# Patient Record
Sex: Female | Born: 1965 | Race: White | Hispanic: No | Smoking: Never smoker
Health system: Southern US, Community
[De-identification: ages and names within clinical notes are randomized; demographics above are authoritative.]

## PROBLEM LIST (undated history)

## (undated) DIAGNOSIS — N809 Endometriosis, unspecified: Secondary | ICD-10-CM

## (undated) DIAGNOSIS — S42402A Unspecified fracture of lower end of left humerus, initial encounter for closed fracture: Secondary | ICD-10-CM

## (undated) DIAGNOSIS — Z9189 Other specified personal risk factors, not elsewhere classified: Secondary | ICD-10-CM

## (undated) DIAGNOSIS — R911 Solitary pulmonary nodule: Secondary | ICD-10-CM

## (undated) DIAGNOSIS — Z803 Family history of malignant neoplasm of breast: Secondary | ICD-10-CM

## (undated) DIAGNOSIS — Z1371 Encounter for nonprocreative screening for genetic disease carrier status: Secondary | ICD-10-CM

## (undated) DIAGNOSIS — J45909 Unspecified asthma, uncomplicated: Secondary | ICD-10-CM

## (undated) HISTORY — PX: ELBOW SURGERY: SHX618

## (undated) HISTORY — DX: Other specified personal risk factors, not elsewhere classified: Z91.89

## (undated) HISTORY — DX: Unspecified asthma, uncomplicated: J45.909

## (undated) HISTORY — DX: Endometriosis, unspecified: N80.9

## (undated) HISTORY — DX: Encounter for nonprocreative screening for genetic disease carrier status: Z13.71

## (undated) HISTORY — DX: Solitary pulmonary nodule: R91.1

## (undated) HISTORY — PX: TONSILLECTOMY AND ADENOIDECTOMY: SUR1326

## (undated) HISTORY — DX: Family history of malignant neoplasm of breast: Z80.3

## (undated) HISTORY — DX: Unspecified fracture of lower end of left humerus, initial encounter for closed fracture: S42.402A

## (undated) HISTORY — PX: LAPAROSCOPIC TOTAL HYSTERECTOMY: SUR800

---

## 2008-06-07 ENCOUNTER — Ambulatory Visit: Payer: Self-pay | Admitting: Unknown Physician Specialty

## 2008-06-22 ENCOUNTER — Ambulatory Visit: Payer: Self-pay | Admitting: Unknown Physician Specialty

## 2008-08-10 ENCOUNTER — Ambulatory Visit: Payer: Self-pay | Admitting: Unknown Physician Specialty

## 2008-08-17 ENCOUNTER — Ambulatory Visit: Payer: Self-pay | Admitting: Unknown Physician Specialty

## 2016-02-09 ENCOUNTER — Inpatient Hospital Stay
Admission: RE | Admit: 2016-02-09 | Discharge: 2016-02-09 | Disposition: A | Discharge status: Home / Self Care | Payer: Self-pay | Source: Ambulatory Visit | Attending: *Deleted | Admitting: *Deleted

## 2016-02-09 ENCOUNTER — Other Ambulatory Visit: Payer: Self-pay | Admitting: *Deleted

## 2016-02-09 DIAGNOSIS — Z9289 Personal history of other medical treatment: Secondary | ICD-10-CM

## 2016-03-22 DIAGNOSIS — E7800 Pure hypercholesterolemia, unspecified: Secondary | ICD-10-CM | POA: Insufficient documentation

## 2016-03-22 DIAGNOSIS — J45909 Unspecified asthma, uncomplicated: Secondary | ICD-10-CM | POA: Insufficient documentation

## 2016-03-27 ENCOUNTER — Other Ambulatory Visit: Payer: Self-pay | Admitting: Family Medicine

## 2016-03-27 DIAGNOSIS — Z1231 Encounter for screening mammogram for malignant neoplasm of breast: Secondary | ICD-10-CM

## 2016-04-12 ENCOUNTER — Ambulatory Visit
Admission: RE | Admit: 2016-04-12 | Discharge: 2016-04-12 | Disposition: A | Discharge status: Home / Self Care | Payer: BLUE CROSS/BLUE SHIELD | Source: Ambulatory Visit | Attending: Family Medicine | Admitting: Family Medicine

## 2016-04-12 ENCOUNTER — Other Ambulatory Visit: Payer: Self-pay | Admitting: Family Medicine

## 2016-04-12 DIAGNOSIS — Z1231 Encounter for screening mammogram for malignant neoplasm of breast: Secondary | ICD-10-CM | POA: Diagnosis not present

## 2016-12-26 ENCOUNTER — Other Ambulatory Visit: Payer: Self-pay | Admitting: Obstetrics and Gynecology

## 2016-12-26 DIAGNOSIS — N6324 Unspecified lump in the left breast, lower inner quadrant: Secondary | ICD-10-CM

## 2017-01-09 ENCOUNTER — Encounter: Payer: Self-pay | Admitting: Obstetrics and Gynecology

## 2017-01-09 ENCOUNTER — Ambulatory Visit
Admission: RE | Admit: 2017-01-09 | Discharge: 2017-01-09 | Disposition: A | Discharge status: Home / Self Care | Payer: BLUE CROSS/BLUE SHIELD | Source: Ambulatory Visit | Attending: Obstetrics and Gynecology | Admitting: Obstetrics and Gynecology

## 2017-01-09 ENCOUNTER — Encounter: Payer: Self-pay | Admitting: Radiology

## 2017-01-09 DIAGNOSIS — N6324 Unspecified lump in the left breast, lower inner quadrant: Secondary | ICD-10-CM | POA: Diagnosis present

## 2017-06-11 ENCOUNTER — Encounter: Payer: Self-pay | Admitting: Certified Nurse Midwife

## 2017-06-11 ENCOUNTER — Ambulatory Visit (INDEPENDENT_AMBULATORY_CARE_PROVIDER_SITE_OTHER): Payer: BLUE CROSS/BLUE SHIELD | Admitting: Certified Nurse Midwife

## 2017-06-11 VITALS — BP 100/80 | HR 48 | Ht 62.0 in | Wt 178.0 lb

## 2017-06-11 DIAGNOSIS — Z01419 Encounter for gynecological examination (general) (routine) without abnormal findings: Secondary | ICD-10-CM | POA: Diagnosis not present

## 2017-06-11 DIAGNOSIS — Z1211 Encounter for screening for malignant neoplasm of colon: Secondary | ICD-10-CM

## 2017-06-11 DIAGNOSIS — Z803 Family history of malignant neoplasm of breast: Secondary | ICD-10-CM | POA: Insufficient documentation

## 2017-06-11 DIAGNOSIS — N949 Unspecified condition associated with female genital organs and menstrual cycle: Secondary | ICD-10-CM | POA: Diagnosis not present

## 2017-06-11 DIAGNOSIS — N898 Other specified noninflammatory disorders of vagina: Secondary | ICD-10-CM

## 2017-06-11 DIAGNOSIS — J45909 Unspecified asthma, uncomplicated: Secondary | ICD-10-CM | POA: Insufficient documentation

## 2017-06-11 LAB — POCT WET PREP (WET MOUNT): TRICHOMONAS WET PREP HPF POC: ABSENT

## 2017-06-11 NOTE — Progress Notes (Signed)
Gynecology Annual Exam  PCP: Kandyce RudBabaoff, Marcus, MD  Chief Complaint:  Chief Complaint  Patient presents with  . Gynecologic Exam    History of Present Illness: Shannon Hughes is a 51 y.o. nulliparous White female who  presents for her annual exam. The patient has no complaints today. Has noticed a white vaginal discharge at times (usually after bathing) but denies vulvovaginal itching or foul odor.   Her menses are absent due to a hysterectomy (TLH, BSO for endometriosis). She has had no spotting Last pap smear: 03/15/2016, results were NIL   The patient is not currently sexually active. She is asking questions regarding lubricants and moisturizers if she does become sexually active.  Since her last visit 03/15/2016, she had a diagnostic mammogram and ultrasound on her left breast for a possible mass on exam: results were negative. She reports that her 50 year old mother has had a stroke, but is recovering nicely.  Her past medical history is remarkable for asthma  The patient does perform self breast exams. Her last bilateral mammogram was 04/13/2016, results were negative.   There is a family history of breast cancer in her maternal aunt and maternal grandmother Genetic testing has not been done.  There is no family history of ovarian cancer.   The patient denies smoking.  She rarely drinks alcohol.  She denies illegal drug use.  The patient does not exercise. She does get adequate calcium in her diet. Her BMI=32.56 kg/m2  Her cholesterol screen was done last year by her PCP  and was borderline  The patient denies current symptoms of depression.    Review of Systems: Review of Systems  Constitutional: Negative for chills, fever and weight loss.  HENT: Negative for congestion, sinus pain and sore throat.   Eyes: Negative for blurred vision and pain.  Respiratory: Negative for hemoptysis, shortness of breath and wheezing.   Cardiovascular: Negative for chest pain,  palpitations and leg swelling.  Gastrointestinal: Negative for abdominal pain, blood in stool, diarrhea, heartburn, nausea and vomiting.  Genitourinary: Negative for dysuria, frequency, hematuria and urgency.       Positive for vaginal discharge  Musculoskeletal: Negative for back pain, joint pain and myalgias.  Skin: Negative for itching and rash.  Neurological: Negative for dizziness, tingling and headaches.  Endo/Heme/Allergies: Negative for environmental allergies and polydipsia. Does not bruise/bleed easily.       Negative for hirsutism   Psychiatric/Behavioral: Negative for depression. The patient is not nervous/anxious and does not have insomnia.     Past Medical History:  Past Medical History:  Diagnosis Date  . Asthma   . Endometriosis     Past Surgical History:  Past Surgical History:  Procedure Laterality Date  . ELBOW SURGERY    . LAPAROSCOPIC TOTAL HYSTERECTOMY     and BSO  . TONSILLECTOMY AND ADENOIDECTOMY     AGE 66 OR 7    Family History:  Family History  Problem Relation Age of Onset  . Breast cancer Maternal Aunt 42  . Breast cancer Maternal Grandmother 60  . Cancer Maternal Uncle 60       ? SKIN  . Stroke Mother     Social History:  Social History   Social History  . Marital status: Divorced    Spouse name: N/A  . Number of children: 0  . Years of education: N/A   Occupational History  . Daycare    Social History Main Topics  . Smoking status: Never  Smoker  . Smokeless tobacco: Never Used  . Alcohol use Yes     Comment: RARELY  . Drug use: No  . Sexual activity: No   Other Topics Concern  . Not on file   Social History Narrative  . No narrative on file    Allergies:  Allergies  Allergen Reactions  . Penicillins Anaphylaxis and Swelling    Medications: Prior to Admission medications   Not on File    Physical Exam Vitals: There were no vitals taken for this visit.  General: NAD HEENT: normocephalic, anicteric Neck: no  thyroid enlargement, no palpable nodules, no cervical lymphadenopathy  Pulmonary: No increased work of breathing, CTAB Cardiovascular: regular rate, but with occasional irregularity, without murmur  Breast: Breast symmetrical, no tenderness, no palpable nodules or masses, no skin or nipple retraction present, no nipple discharge.  No axillary, infraclavicular or supraclavicular lymphadenopathy. Abdomen: Soft, non-tender, non-distended.  Umbilicus without lesions.  No hepatomegaly or masses palpable. No evidence of hernia. Genitourinary:  External: Normal external female genitalia.  Normal urethral meatus, normal Bartholin's and Skene's glands.    Vagina: atrophic changes,  no evidence of prolapse.    Cervix: surgically absent  Uterus: surgically absent  Adnexa: No adnexal masses, non-tender  Rectal: deferred  Lymphatic: no evidence of inguinal lymphadenopathy Extremities: no edema, erythema, or tenderness Neurologic: Grossly intact Psychiatric: mood appropriate, affect full  Results for orders placed or performed in visit on 06/11/17 (from the past 24 hour(s))  POCT Wet Prep Mellody Drown([place]et [place])     Status: Normal   Collection Time: 06/11/17  5:43 PM  Result Value Ref Range   Source Wet Prep POC vaginal    WBC, Wet Prep HPF POC     Bacteria Wet Prep HPF POC  Few   BACTERIA WET PREP MORPHOLOGY POC     Clue Cells Wet Prep HPF POC None None   Clue Cells Wet Prep Whiff POC     Yeast Wet Prep HPF POC None    KOH Wet Prep POC     Trichomonas Wet Prep HPF POC Absent Absent     Assessment: 51 y.o. G0P0000 annual gyn exam Genital atrophy postmenopausally       Plan:   1) Breast cancer screening - recommend monthly self breast exam. Mammogram was ordered today. [facility]  2)Pap was not done.  Patient opts for every 3 years screening interval  3) Colon cancer screening-will refer for colonoscopy to Dr Markham JordanElliot  4) Discussed the difference between moisturizers and  lubricants and how they are used.  5) Routine healthcare maintenance including cholesterol and diabetes screening managed by PCP   Farrel Connersolleen Kinan Safley, CNM

## 2018-02-07 ENCOUNTER — Other Ambulatory Visit: Payer: Self-pay | Admitting: Certified Nurse Midwife

## 2018-02-07 DIAGNOSIS — Z1231 Encounter for screening mammogram for malignant neoplasm of breast: Secondary | ICD-10-CM

## 2018-02-24 ENCOUNTER — Ambulatory Visit
Admission: RE | Admit: 2018-02-24 | Discharge: 2018-02-24 | Disposition: A | Discharge status: Home / Self Care | Payer: BLUE CROSS/BLUE SHIELD | Source: Ambulatory Visit | Attending: Certified Nurse Midwife | Admitting: Certified Nurse Midwife

## 2018-02-24 DIAGNOSIS — Z1231 Encounter for screening mammogram for malignant neoplasm of breast: Secondary | ICD-10-CM | POA: Diagnosis present

## 2018-08-12 DIAGNOSIS — Z9189 Other specified personal risk factors, not elsewhere classified: Secondary | ICD-10-CM

## 2018-08-12 DIAGNOSIS — Z1371 Encounter for nonprocreative screening for genetic disease carrier status: Secondary | ICD-10-CM

## 2018-08-12 HISTORY — DX: Encounter for nonprocreative screening for genetic disease carrier status: Z13.71

## 2018-08-12 HISTORY — DX: Other specified personal risk factors, not elsewhere classified: Z91.89

## 2018-08-18 DIAGNOSIS — R739 Hyperglycemia, unspecified: Secondary | ICD-10-CM | POA: Insufficient documentation

## 2018-08-18 DIAGNOSIS — Z780 Asymptomatic menopausal state: Secondary | ICD-10-CM | POA: Insufficient documentation

## 2018-08-22 ENCOUNTER — Encounter: Payer: Self-pay | Admitting: Certified Nurse Midwife

## 2018-08-22 ENCOUNTER — Ambulatory Visit (INDEPENDENT_AMBULATORY_CARE_PROVIDER_SITE_OTHER): Payer: BLUE CROSS/BLUE SHIELD | Admitting: Certified Nurse Midwife

## 2018-08-22 VITALS — BP 110/60 | HR 65 | Ht 65.0 in | Wt 183.0 lb

## 2018-08-22 DIAGNOSIS — Z803 Family history of malignant neoplasm of breast: Secondary | ICD-10-CM | POA: Diagnosis not present

## 2018-08-22 DIAGNOSIS — Z1239 Encounter for other screening for malignant neoplasm of breast: Secondary | ICD-10-CM | POA: Diagnosis not present

## 2018-08-22 DIAGNOSIS — N951 Menopausal and female climacteric states: Secondary | ICD-10-CM | POA: Diagnosis not present

## 2018-08-22 DIAGNOSIS — Z01411 Encounter for gynecological examination (general) (routine) with abnormal findings: Secondary | ICD-10-CM | POA: Diagnosis not present

## 2018-08-22 DIAGNOSIS — Z01419 Encounter for gynecological examination (general) (routine) without abnormal findings: Secondary | ICD-10-CM

## 2018-08-22 NOTE — Progress Notes (Addendum)
Gynecology Annual Exam  PCP: Kandyce Rud, MD  Chief Complaint:  Chief Complaint  Patient presents with  . Gynecologic Exam    History of Present Illness: Shannon Hughes is a 52 y.o. nulliparous White female who  presents for her annual exam. The patient has developed hot flashes and night sweats recently and her PCP has started her on Paxil 10 mgm daily about 3-4 weeks ago. She has noticed some improvement. Took ET for less than 1 year immediately after surgical menopause.  Her menses are absent due to a hysterectomy (TLH, BSO for endometriosis in her later 30s). She has had no spotting Last pap smear: 03/15/2016, results were NIL   The patient is not currently sexually active. She is asking questions regarding lubricants and moisturizers if she does become sexually active.  Since her last visit 06/11/2017, she had no other significant changes to her health.  Her past medical history is remarkable for asthma, endometriosis, and hyperlipidemia  The patient does perform occ self breast exams. Her last bilateral mammogram was 02/24/18, results were negative.   There is a family history of breast cancer in her maternal aunt, maternal grandmother, and now her maternal cousin. Genetic testing has not been done.  There is no family history of ovarian cancer.   She had a colonoscopy 11/18/2017. Results: normal. Next due in 10 years.  The patient denies smoking.  She rarely drinks alcohol.  She denies illegal drug use.  The patient has been exercising at the gym 4x/week.. She does get adequate calcium in her diet. Her BMI=30.45 kg/m2  Her cholesterol screen was done this year  and was borderline  The patient denies current symptoms of depression.    Review of Systems: Review of Systems  Constitutional: Negative for chills, fever and weight loss.  HENT: Negative for congestion, sinus pain and sore throat.   Eyes: Negative for blurred vision and pain.  Respiratory: Positive  for cough. Negative for hemoptysis, shortness of breath and wheezing.   Cardiovascular: Negative for chest pain, palpitations and leg swelling.  Gastrointestinal: Negative for abdominal pain, blood in stool, diarrhea, heartburn, nausea and vomiting.  Genitourinary: Negative for dysuria, frequency, hematuria and urgency.       Positive for vaginal discharge  Musculoskeletal: Negative for back pain, joint pain and myalgias.  Skin: Negative for itching and rash.  Neurological: Negative for dizziness, tingling and headaches.  Endo/Heme/Allergies: Negative for environmental allergies and polydipsia. Does not bruise/bleed easily.       Positive for hirsutism and hot flashes   Psychiatric/Behavioral: Negative for depression. The patient is not nervous/anxious and does not have insomnia.     Past Medical History:  Past Medical History:  Diagnosis Date  . Asthma   . Endometriosis     Past Surgical History:  Past Surgical History:  Procedure Laterality Date  . ELBOW SURGERY    . LAPAROSCOPIC TOTAL HYSTERECTOMY     and BSO  . TONSILLECTOMY AND ADENOIDECTOMY     AGE 9 OR 7    Family History:  Family History  Problem Relation Age of Onset  . Breast cancer Maternal Aunt 70  . Breast cancer Maternal Grandmother 22  . Cancer Maternal Uncle 60       ? SKIN  . Stroke Mother   . Breast cancer Cousin 41       [name]    Social History:  Social History   Socioeconomic History  . Marital status: Divorced  Spouse name: Not on file  . Number of children: 0  . Years of education: Not on file  . Highest education level: Not on file  Occupational History  . Occupation: Daycare  Social Needs  . Financial resource strain: Not on file  . Food insecurity:    Worry: Not on file    Inability: Not on file  . Transportation needs:    Medical: Not on file    Non-medical: Not on file  Tobacco Use  . Smoking status: Never Smoker  . Smokeless tobacco: Never Used  Substance and  Sexual Activity  . Alcohol use: Yes    Comment: RARELY  . Drug use: No  . Sexual activity: Never    Birth control/protection: Post-menopausal  Lifestyle  . Physical activity:    Days per week: Not on file    Minutes per session: Not on file  . Stress: Not on file  Relationships  . Social connections:    Talks on phone: Not on file    Gets together: Not on file    Attends religious service: Not on file    Active member of club or organization: Not on file    Attends meetings of clubs or organizations: Not on file    Relationship status: Not on file  . Intimate partner violence:    Fear of current or ex partner: Not on file    Emotionally abused: Not on file    Physically abused: Not on file    Forced sexual activity: Not on file  Other Topics Concern  . Not on file  Social History Narrative  . Not on file    Allergies:  Allergies  Allergen Reactions  . Penicillins Anaphylaxis and Swelling    Medications: Vitamin D3 1000 IU daily Current Outpatient Medications on File Prior to Visit  Medication Sig Dispense Refill  . PARoxetine (PAXIL) 10 MG tablet   0  . albuterol (PROVENTIL HFA;VENTOLIN HFA) 108 (90 Base) MCG/ACT inhaler Inhale 2 puffs into the lungs as needed.     No current facility-administered medications on file prior to visit.    Physical Exam Vitals: BP 110/60   Pulse 65   Ht 5\' 5"  (1.651 m)   Wt 183 lb (83 kg)   BMI 30.45 kg/m   General: WF in NAD HEENT: normocephalic, anicteric Neck: no thyroid enlargement, no palpable nodules, no cervical lymphadenopathy  Pulmonary: No increased work of breathing, CTAB Cardiovascular: regular rate, but with occasional irregularity, without murmur  Breast: Breast symmetrical, no tenderness, no palpable nodules or masses, no skin or nipple retraction present, no nipple discharge.  No axillary, infraclavicular or supraclavicular lymphadenopathy. Abdomen: Soft, non-tender, non-distended.  Umbilicus without lesions.  No  hepatomegaly or masses palpable. No evidence of hernia. Genitourinary:  External: Normal external female genitalia.  Normal urethral meatus, normal Bartholin's and Skene's glands.    Vagina: atrophic changes,  no evidence of prolapse.    Cervix: surgically absent  Uterus: surgically absent  Adnexa: No adnexal masses, non-tender  Rectal: deferred  Lymphatic: no evidence of inguinal lymphadenopathy Extremities: no edema, erythema, or tenderness Neurologic: Grossly intact Psychiatric: mood appropriate, affect full    Assessment: 52 y.o. G0P0000 annual gyn exam Genital atrophy postmenopausally Vasomotor symptoms Family history of breast cancer   Plan:   1) Breast cancer screening - recommend monthly self breast exam. Mammogram was ordered today. Patient to schedule at [facility] after 02/25/2019. Patient desires MYRISK testing and blood work was drawn today. Follow up in  6 weeks for test results.  2)Pap was not done.  Patient opts for every 3 years screening interval  3) Colon cancer screening-colonoscopy this year. Repeat due in 10 years.  4) Discussed the difference between moisturizers and lubricants and how they are used. Patient would like trial of [place]. Discussed how to use.  5) Routine healthcare maintenance including cholesterol and diabetes screening managed by PCP    Farrel Conners, CNM

## 2018-08-24 ENCOUNTER — Encounter: Payer: Self-pay | Admitting: Certified Nurse Midwife

## 2018-09-02 ENCOUNTER — Encounter: Payer: Self-pay | Admitting: Obstetrics and Gynecology

## 2018-09-02 ENCOUNTER — Telehealth: Payer: Self-pay | Admitting: Certified Nurse Midwife

## 2018-09-02 NOTE — Telephone Encounter (Signed)
Left voicemail for patient to call back to be schedule with [facility] for My risk results.

## 2018-09-18 ENCOUNTER — Encounter: Payer: Self-pay | Admitting: Certified Nurse Midwife

## 2018-09-18 ENCOUNTER — Ambulatory Visit: Payer: BLUE CROSS/BLUE SHIELD | Admitting: Certified Nurse Midwife

## 2018-09-18 ENCOUNTER — Ambulatory Visit (INDEPENDENT_AMBULATORY_CARE_PROVIDER_SITE_OTHER): Payer: BLUE CROSS/BLUE SHIELD | Admitting: Certified Nurse Midwife

## 2018-09-18 VITALS — BP 90/60 | HR 66 | Ht 65.5 in | Wt 181.0 lb

## 2018-09-18 DIAGNOSIS — Z712 Person consulting for explanation of examination or test findings: Secondary | ICD-10-CM

## 2018-09-18 DIAGNOSIS — Z803 Family history of malignant neoplasm of breast: Secondary | ICD-10-CM

## 2018-09-18 NOTE — Progress Notes (Signed)
  Obstetrics & Gynecology Office Visit   Chief Complaint:  Chief Complaint  Patient presents with  . Follow-up    Test results    History of Present Illness: 52 year old nulliparous female with a family history of breast cancer presents for her MYRISK test results. The results were negative, however the TC Model calculates her lifetime risk of breast cancer at 21 % and the MYRISK calculation is 18.2%.   She has annual mammograms  And her last mammogram was 02/24/2018 and was negative. Her breast exam last month was normal.  Review of Systems:  ROS   Past Medical History:  Past Medical History:  Diagnosis Date  . Asthma   . BRCA negative 08/2018   MyRisk neg  . Endometriosis   . Family history of breast cancer   . Increased risk of breast cancer 08/2018   IBIS=21%/riskscore=18.2%  . Left elbow fracture   . Nodule of left lung     Past Surgical History:  Past Surgical History:  Procedure Laterality Date  . ELBOW SURGERY    . LAPAROSCOPIC TOTAL HYSTERECTOMY     and BSO-endometriosis  . TONSILLECTOMY AND ADENOIDECTOMY     AGE 6 OR 7    Gynecologic History: No LMP recorded. Patient has had a hysterectomy.  Obstetric History: G0P0000  Family History:  Family History  Problem Relation Age of Onset  . Breast cancer Maternal Aunt 42  . Breast cancer Maternal Grandmother 60  . Cancer Maternal Uncle 60       ? SKIN  . Stroke Mother   . Diabetes Mother   . Breast cancer Cousin 75       [name]    Social History:  Social History   Socioeconomic History  . Marital status: Divorced    Spouse name: Not on file  . Number of children: 0  . Years of education: Not on file  . Highest education level: Not on file  Occupational History  . Occupation: Daycare  Social Needs  . Financial resource strain: Not on file  . Food insecurity:    Worry: Not on file    Inability: Not on file  . Transportation needs:    Medical: Not on file    Non-medical: Not  on file  Tobacco Use  . Smoking status: Never Smoker  . Smokeless tobacco: Never Used  Substance and Sexual Activity  . Alcohol use: Yes    Comment: RARELY  . Drug use: No  . Sexual activity: Never    Birth control/protection: Post-menopausal  Lifestyle  . Physical activity:    Days per week: Not on file    Minutes per session: Not on file  . Stress: Not on file  Relationships  . Social connections:    Talks on phone: Not on file    Gets together: Not on file    Attends religious service: Not on file    Active member of club or organization: Not on file    Attends meetings of clubs or organizations: Not on file    Relationship status: Not on file  . Intimate partner violence:    Fear of current or ex partner: Not on file    Emotionally abused: Not on file    Physically abused: Not on file    Forced sexual activity: Not on file  Other Topics Concern  . Not on file  Social History Narrative  . Not on file    Allergies:  Allergies  Allergen   Reactions  . Penicillins Anaphylaxis and Swelling    Medications: Prior to Admission medications   Medication Sig Start Date End Date Taking? Authorizing Provider  albuterol (PROVENTIL HFA;VENTOLIN HFA) 108 (90 Base) MCG/ACT inhaler Inhale 2 puffs into the lungs as needed. 11/16/16 11/16/17  [provider]  azithromycin (ZITHROMAX) 250 MG tablet Take 1 tablet by mouth as needed. 08/31/18   [provider]  cholecalciferol (VITAMIN D) 1000 units tablet Take 1,000 Units by mouth daily.    [provider]  OVER THE COUNTER MEDICATION Take 1 tablet by mouth daily. multivitamin    [provider]  PARoxetine (PAXIL) 10 MG tablet  07/30/18   [provider]  PROAIR HFA 108 (90 Base) MCG/ACT inhaler Place 2 puffs into both nostrils as needed. 08/31/18   [provider]    Physical Exam Vitals:  Vitals:   09/18/18 1351  BP: 90/60  Pulse: 66   No LMP recorded. Patient has had a  hysterectomy.  Physical Exam  Constitutional: She is oriented to person, place, and time. She appears well-developed and well-nourished. No distress.  Cardiovascular: Normal rate.  Respiratory: Effort normal.  Neurological: She is alert and oriented to person, place, and time.  Psychiatric:  anxious     Assessment: 52 y.o. G0P0000 with negative MYRISK test but lifetime risk of breast cancer at 18-21%.  Plan: Recommend continuing annual 3D mammograms\ Explained that she also qualifies for annual breast MRIs and professional breast exams every 6 months She is in the process of changing insurances and would like to possibly start the breast MRIs next year. Advised to schedule breast exam in 6 months if desires.  Colleen Gutierrez, CNM  

## 2019-04-23 ENCOUNTER — Ambulatory Visit
Admission: RE | Admit: 2019-04-23 | Discharge: 2019-04-23 | Disposition: A | Discharge status: Home / Self Care | Payer: PRIVATE HEALTH INSURANCE | Source: Ambulatory Visit | Attending: Certified Nurse Midwife | Admitting: Certified Nurse Midwife

## 2019-04-23 ENCOUNTER — Other Ambulatory Visit: Payer: Self-pay

## 2019-04-23 DIAGNOSIS — Z1239 Encounter for other screening for malignant neoplasm of breast: Secondary | ICD-10-CM

## 2019-04-23 DIAGNOSIS — Z1231 Encounter for screening mammogram for malignant neoplasm of breast: Secondary | ICD-10-CM | POA: Insufficient documentation

## 2019-11-24 ENCOUNTER — Ambulatory Visit (INDEPENDENT_AMBULATORY_CARE_PROVIDER_SITE_OTHER): Payer: 59 | Admitting: Certified Nurse Midwife

## 2019-11-24 ENCOUNTER — Other Ambulatory Visit (HOSPITAL_COMMUNITY)
Admission: RE | Admit: 2019-11-24 | Discharge: 2019-11-24 | Disposition: A | Discharge status: Home / Self Care | Payer: PRIVATE HEALTH INSURANCE | Source: Ambulatory Visit | Attending: Certified Nurse Midwife | Admitting: Certified Nurse Midwife

## 2019-11-24 ENCOUNTER — Encounter: Payer: Self-pay | Admitting: Certified Nurse Midwife

## 2019-11-24 ENCOUNTER — Other Ambulatory Visit: Payer: Self-pay

## 2019-11-24 VITALS — BP 110/80 | HR 54 | Ht 60.0 in | Wt 186.0 lb

## 2019-11-24 DIAGNOSIS — Z1239 Encounter for other screening for malignant neoplasm of breast: Secondary | ICD-10-CM

## 2019-11-24 DIAGNOSIS — E785 Hyperlipidemia, unspecified: Secondary | ICD-10-CM | POA: Insufficient documentation

## 2019-11-24 DIAGNOSIS — N951 Menopausal and female climacteric states: Secondary | ICD-10-CM | POA: Insufficient documentation

## 2019-11-24 DIAGNOSIS — Z1272 Encounter for screening for malignant neoplasm of vagina: Secondary | ICD-10-CM

## 2019-11-24 DIAGNOSIS — Z01419 Encounter for gynecological examination (general) (routine) without abnormal findings: Secondary | ICD-10-CM

## 2019-11-24 NOTE — Progress Notes (Signed)
Gynecology Annual Exam  PCP: Derinda Late, MD  Chief Complaint:  Chief Complaint  Patient presents with  . Gynecologic Exam    one episode of upper left breast pain    History of Present Illness: Shannon Hughes is a 54 y.o. nulliparous White female who  presents for her annual exam.   Her menses are absent due to a hysterectomy (TLH, BSO for endometriosis in her later 63s). She has had no spotting The patient has been taking Paxil 10 mgm for hot flashes and night sweats with relief. Took ET for less than 1 year immediately after surgical menopause.  Last pap smear: 03/15/2016, results were NIL  The patient is not currently sexually active.   Since her last visit 08/22/2018, she had no other significant changes to her health. Has noticed some intermittent numbness in hands rt>left. More recently she had some transient left upper breast tenderness/ lumpiness. This resolved spontaneously. She does admit to drinking caffeine products  Her past medical history is remarkable for asthma, endometriosis, and hyperlipidemia  The patient does perform occ self breast exams. Her last bilateral mammogram was 04/23/2019, results were negative.   There is a family history of breast cancer in her maternal aunt, maternal grandmother, and now her maternal cousin. The patient had genetic testing and her Myrisk results were negative. Her lifetime risk of breast cancer is 18.2% according to [facility]  There is no family history of ovarian cancer.   She had a colonoscopy 11/18/2017. Results: normal. Next due in 10 years.  The patient denies smoking.  She rarely drinks alcohol.  She denies illegal drug use.  The patient has stopped exercising at the gym. She has been walking for exercise. She does get adequate calcium in her diet. Her BMI=30.63 kg/m2  Her cholesterol screen was done this year and was abnormal (TC 249 and triglycerides 469). Hemoglobin A1C is 6.0%  The patient denies current  symptoms of depression.    Review of Systems: Review of Systems  Constitutional: Negative for chills, fever and weight loss.  HENT: Negative for congestion, sinus pain and sore throat.   Eyes: Negative for blurred vision and pain.  Respiratory: Positive for cough. Negative for hemoptysis, shortness of breath and wheezing.   Cardiovascular: Negative for chest pain, palpitations and leg swelling.  Gastrointestinal: Negative for abdominal pain, blood in stool, diarrhea, heartburn, nausea and vomiting.  Genitourinary: Negative for dysuria, frequency, hematuria and urgency.  Musculoskeletal: Negative for back pain, joint pain and myalgias.  Skin: Negative for itching and rash.  Neurological: Negative for dizziness, tingling and headaches.  Endo/Heme/Allergies: Positive for environmental allergies (with sneezing and coughing). Negative for polydipsia. Does not bruise/bleed easily.       Positive for hirsutism and hot flashes   Psychiatric/Behavioral: Negative for depression. The patient is not nervous/anxious and does not have insomnia.     Past Medical History:  Past Medical History:  Diagnosis Date  . Asthma   . BRCA negative 08/2018   MyRisk neg  . Endometriosis   . Family history of breast cancer   . Increased risk of breast cancer 08/2018   IBIS=21%/riskscore=18.2%  . Left elbow fracture   . Nodule of left lung     Past Surgical History:  Past Surgical History:  Procedure Laterality Date  . ELBOW SURGERY    . LAPAROSCOPIC TOTAL HYSTERECTOMY     and BSO-endometriosis  . TONSILLECTOMY AND ADENOIDECTOMY     AGE 4 OR 7  Family History:  Family History  Problem Relation Age of Onset  . Breast cancer Maternal Aunt 42  . Breast cancer Maternal Grandmother 60  . Cancer Maternal Uncle 60       ? SKIN  . Stroke Mother   . Diabetes Mother   . Breast cancer Cousin 75       [name]    Social History:  Social History   Socioeconomic History  . Marital  status: Divorced    Spouse name: Not on file  . Number of children: 0  . Years of education: Not on file  . Highest education level: Not on file  Occupational History  . Occupation: Daycare  Tobacco Use  . Smoking status: Never Smoker  . Smokeless tobacco: Never Used  Substance and Sexual Activity  . Alcohol use: Yes    Comment: RARELY  . Drug use: No  . Sexual activity: Not Currently    Birth control/protection: Post-menopausal  Other Topics Concern  . Not on file  Social History Narrative  . Not on file   Social Determinants of Health   Financial Resource Strain:   . Difficulty of Paying Living Expenses: Not on file  Food Insecurity:   . Worried About Charity fundraiser in the Last Year: Not on file  . Ran Out of Food in the Last Year: Not on file  Transportation Needs:   . Lack of Transportation (Medical): Not on file  . Lack of Transportation (Non-Medical): Not on file  Physical Activity:   . Days of Exercise per Week: Not on file  . Minutes of Exercise per Session: Not on file  Stress:   . Feeling of Stress : Not on file  Social Connections:   . Frequency of Communication with Friends and Family: Not on file  . Frequency of Social Gatherings with Friends and Family: Not on file  . Attends Religious Services: Not on file  . Active Member of Clubs or Organizations: Not on file  . Attends Archivist Meetings: Not on file  . Marital Status: Not on file  Intimate Partner Violence:   . Fear of Current or Ex-Partner: Not on file  . Emotionally Abused: Not on file  . Physically Abused: Not on file  . Sexually Abused: Not on file    Allergies:  Allergies  Allergen Reactions  . Penicillins Anaphylaxis and Swelling    Medications: Vitamin D3 1000 IU daily Current Outpatient Medications on File Prior to Visit  Medication Sig Dispense Refill  . cholecalciferol (VITAMIN D) 1000 units tablet Take 1,000 Units by mouth daily.    Marland Kitchen OVER THE COUNTER MEDICATION  Take 1 tablet by mouth daily. multivitamin    . PARoxetine (PAXIL) 10 MG tablet   0  . PROAIR HFA 108 (90 Base) MCG/ACT inhaler Place 2 puffs into both nostrils as needed.  0  . albuterol (PROVENTIL HFA;VENTOLIN HFA) 108 (90 Base) MCG/ACT inhaler Inhale 2 puffs into the lungs as needed.     No current facility-administered medications on file prior to visit.   Physical Exam Vitals: BP 110/80   Pulse (!) 54   Ht 5' (1.524 m)   Wt 186 lb (84.4 kg)   BMI 36.33 kg/m   General: WF in NAD HEENT: normocephalic, anicteric Neck: no thyroid enlargement, no palpable nodules, no cervical lymphadenopathy  Pulmonary: No increased work of breathing, CTAB Cardiovascular: regular rate, but with occasional irregularity, without murmur  Breast: Breast symmetrical, no tenderness, no palpable  nodules or masses, no skin or nipple retraction present, no nipple discharge.  No axillary, infraclavicular or supraclavicular lymphadenopathy. Abdomen: Soft, non-tender, non-distended.  Umbilicus without lesions.  No hepatomegaly or masses palpable. No evidence of hernia. Genitourinary:  External: Normal external female genitalia.  Normal urethral meatus, normal Bartholin's and Skene's glands.    Vagina: atrophic changes,  no evidence of prolapse.    Cervix: surgically absent  Uterus: surgically absent  Adnexa: No adnexal masses, non-tender  Rectal: deferred  Lymphatic: no evidence of inguinal lymphadenopathy Extremities: no edema, erythema, or tenderness Neurologic: Grossly intact Psychiatric: mood appropriate, affect full    Assessment: 54 y.o. G0P0000 annual gyn exam Genital atrophy postmenopausally Family history of breast cancer Irregular heart rate Possible carpel tunnel symptoms   Plan:   1) Breast cancer screening - recommend monthly self breast exam and continued annual mammograms Mammogram due after 04/22/2020 2)Pap was done.  Patient opts for every 3 years screening interval  3) Colon  cancer screening-colonoscopy 2019. Repeat due in 10 years (2029)  4) Recommend making appointment with PCP for irregular heart beat (EKG) and carpel tunnel symptoms  5) Routine healthcare maintenance including cholesterol and diabetes screening managed by PCP   6) RTO in 1 year for annual   Dalia Heading, [place]

## 2019-11-26 ENCOUNTER — Telehealth: Payer: Self-pay

## 2019-11-26 LAB — CYTOLOGY - PAP: Diagnosis: NEGATIVE

## 2019-11-26 NOTE — Telephone Encounter (Signed)
Pt needs CLG to put in a referral for her EKG for her heart

## 2019-12-08 ENCOUNTER — Other Ambulatory Visit: Payer: Self-pay | Admitting: Certified Nurse Midwife

## 2019-12-08 DIAGNOSIS — I499 Cardiac arrhythmia, unspecified: Secondary | ICD-10-CM

## 2019-12-08 NOTE — Telephone Encounter (Signed)
EKG ordered

## 2019-12-10 ENCOUNTER — Ambulatory Visit: Payer: 59

## 2020-01-30 ENCOUNTER — Ambulatory Visit: Payer: 59 | Attending: Internal Medicine

## 2020-01-30 DIAGNOSIS — Z23 Encounter for immunization: Secondary | ICD-10-CM

## 2020-01-30 NOTE — Progress Notes (Signed)
   Covid-19 Vaccination Clinic  Name:  Shannon Hughes    MRN: [id number] DOB: 08/23/1966  01/30/2020  Shannon Hughes was observed post Covid-19 immunization for 15 minutes without incident. She was provided with Vaccine Information Sheet and instruction to access the V-Safe system.   Shannon Hughes was instructed to call 911 with any severe reactions post vaccine: Marland Kitchen Difficulty breathing  . Swelling of face and throat  . A fast heartbeat  . A bad rash all over body  . Dizziness and weakness   Immunizations Administered    Name Date Dose VIS Date Route   Pfizer COVID-19 Vaccine 01/30/2020  5:22 PM 0.3 mL 10/23/2019 Intramuscular   Manufacturer: [employer], [employer]   Lot: [id number]   NDC: [address]-3

## 2020-02-20 ENCOUNTER — Ambulatory Visit: Payer: 59 | Attending: Internal Medicine

## 2020-02-20 DIAGNOSIS — Z23 Encounter for immunization: Secondary | ICD-10-CM

## 2020-02-20 NOTE — Progress Notes (Signed)
   Covid-19 Vaccination Clinic  Name:  JULLIE ARPS    MRN: [id number] DOB: 07/12/1966  02/20/2020  Ms. Manges was observed post Covid-19 immunization for 15 minutes without incident. She was provided with Vaccine Information Sheet and instruction to access the V-Safe system.   Ms. Runions was instructed to call 911 with any severe reactions post vaccine: Marland Kitchen Difficulty breathing  . Swelling of face and throat  . A fast heartbeat  . A bad rash all over body  . Dizziness and weakness   Immunizations Administered    Name Date Dose VIS Date Route   Pfizer COVID-19 Vaccine 02/20/2020  4:51 PM 0.3 mL 10/23/2019 Intramuscular   Manufacturer: [employer], [employer]   Lot: [id number]   NDC: [address]-2

## 2020-03-15 ENCOUNTER — Other Ambulatory Visit: Payer: Self-pay

## 2020-03-15 ENCOUNTER — Encounter: Payer: Self-pay | Admitting: Obstetrics and Gynecology

## 2020-03-15 ENCOUNTER — Ambulatory Visit (INDEPENDENT_AMBULATORY_CARE_PROVIDER_SITE_OTHER): Payer: 59 | Admitting: Obstetrics and Gynecology

## 2020-03-15 VITALS — BP 110/80 | Ht 65.5 in | Wt 186.0 lb

## 2020-03-15 DIAGNOSIS — N6310 Unspecified lump in the right breast, unspecified quadrant: Secondary | ICD-10-CM

## 2020-03-15 DIAGNOSIS — Z1231 Encounter for screening mammogram for malignant neoplasm of breast: Secondary | ICD-10-CM

## 2020-03-15 DIAGNOSIS — N644 Mastodynia: Secondary | ICD-10-CM | POA: Diagnosis not present

## 2020-03-15 NOTE — Progress Notes (Signed)
Shannon Late, MD   Chief Complaint  Patient presents with  . Breast exam    lump on right breast, little pain noticed last week    HPI:      Shannon Hughes is a 54 y.o. G0P0000 who LMP was No LMP recorded. Patient has had a hysterectomy., presents today for RT breast mass and pain for the past wk. Area is tender and noted on SBE; very deep under nipple. Pt thinks area has gotten a little smaller since she first noticed it. No erythema, trauma, nipple d/c. No hx of breast masses. Neg mammo 04/23/19 at [facility]. Strong FH breast cancer, pt is MyRisk neg but increased risk based on FH. IBIS=21%/riskscore=18.2%.   Past Medical History:  Diagnosis Date  . Asthma   . BRCA negative 08/2018   MyRisk neg  . Endometriosis   . Family history of breast cancer   . Increased risk of breast cancer 08/2018   IBIS=21%/riskscore=18.2%  . Left elbow fracture   . Nodule of left lung     Past Surgical History:  Procedure Laterality Date  . ELBOW SURGERY    . LAPAROSCOPIC TOTAL HYSTERECTOMY     and BSO-endometriosis  . TONSILLECTOMY AND ADENOIDECTOMY     AGE 17 OR 7    Family History  Problem Relation Age of Onset  . Breast cancer Maternal Aunt 42  . Breast cancer Maternal Grandmother 60  . Cancer Maternal Uncle 60       ? SKIN  . Stroke Mother   . Diabetes Mother   . Breast cancer Cousin 80       Shannon Hughes's daughter    Social History   Socioeconomic History  . Marital status: Divorced    Spouse name: Not on file  . Number of children: 0  . Years of education: Not on file  . Highest education level: Not on file  Occupational History  . Occupation: Daycare  Tobacco Use  . Smoking status: Never Smoker  . Smokeless tobacco: Never Used  Substance and Sexual Activity  . Alcohol use: Yes    Comment: RARELY  . Drug use: No  . Sexual activity: Not Currently    Birth control/protection: Post-menopausal  Other Topics Concern  . Not on file  Social History Narrative  .  Not on file   Social Determinants of Health   Financial Resource Strain:   . Difficulty of Paying Living Expenses:   Food Insecurity:   . Worried About Charity fundraiser in the Last Year:   . Arboriculturist in the Last Year:   Transportation Needs:   . Film/video editor (Medical):   Marland Kitchen Lack of Transportation (Non-Medical):   Physical Activity:   . Days of Exercise per Week:   . Minutes of Exercise per Session:   Stress:   . Feeling of Stress :   Social Connections:   . Frequency of Communication with Friends and Family:   . Frequency of Social Gatherings with Friends and Family:   . Attends Religious Services:   . Active Member of Clubs or Organizations:   . Attends Archivist Meetings:   Marland Kitchen Marital Status:   Intimate Partner Violence:   . Fear of Current or Ex-Partner:   . Emotionally Abused:   Marland Kitchen Physically Abused:   . Sexually Abused:     Outpatient Medications Prior to Visit  Medication Sig Dispense Refill  . cholecalciferol (VITAMIN D) 1000 units tablet Take  1,000 Units by mouth daily.    Marland Kitchen OVER THE COUNTER MEDICATION Take 1 tablet by mouth daily. multivitamin    . PARoxetine (PAXIL) 10 MG tablet   0  . PROAIR HFA 108 (90 Base) MCG/ACT inhaler Place 2 puffs into both nostrils as needed.  0  . albuterol (PROVENTIL HFA;VENTOLIN HFA) 108 (90 Base) MCG/ACT inhaler Inhale 2 puffs into the lungs as needed.     No facility-administered medications prior to visit.      ROS:  Review of Systems  Constitutional: Negative for fever.  Gastrointestinal: Negative for blood in stool, constipation, diarrhea, nausea and vomiting.  Genitourinary: Negative for dyspareunia, dysuria, flank pain, frequency, hematuria, urgency, vaginal bleeding, vaginal discharge and vaginal pain.  Musculoskeletal: Negative for back pain.  Skin: Negative for rash.   BREAST: mass/pain   OBJECTIVE:   Vitals:  BP 110/80   Ht 5' 5.5" (1.664 m)   Wt 186 lb (84.4 kg)   BMI 30.48  kg/m   Physical Exam Vitals reviewed.  Pulmonary:     Effort: Pulmonary effort is normal.  Chest:     Breasts: Breasts are symmetrical.        Right: Mass and tenderness present. No inverted nipple, nipple discharge or skin change.        Left: No inverted nipple, mass, nipple discharge, skin change or tenderness.    Musculoskeletal:        General: Normal range of motion.     Cervical back: Normal range of motion.  Skin:    General: Skin is warm and dry.  Neurological:     General: No focal deficit present.     Mental Status: She is alert and oriented to person, place, and time.     Cranial Nerves: No cranial nerve deficit.  Psychiatric:        Mood and Affect: Mood normal.        Behavior: Behavior normal.        Thought Content: Thought content normal.        Judgment: Judgment normal.     Assessment/Plan: Breast mass, right - Plan: MM DIAG BREAST TOMO UNI RIGHT, US BREAST LTD UNI RIGHT INC AXILLA  Breast pain, right - Plan: MM DIAG BREAST TOMO UNI RIGHT, US BREAST LTD UNI RIGHT INC AXILLA  Check dx mammo and u/s RT breast. If neg, reassurance. No discrete mass on exam but pt states it is deep. Very tender on exam. Will f/u with results.     Return if symptoms worsen or fail to improve.  Arwilda Georgia B. Niyla Marone, PA-C 03/15/2020 4:51 PM

## 2020-03-15 NOTE — Patient Instructions (Signed)
I value your feedback and entrusting us with your care. If you get a [facility] patient survey, I would appreciate you taking the time to let us know about your experience today. Thank you!  As of October 22, 2019, your lab results will be released to your MyChart immediately, before I even have a chance to see them. Please give me time to review them and contact you if there are any abnormalities. Thank you for your patience.  

## 2020-03-16 NOTE — Addendum Note (Signed)
Addended by: Althea Grimmer B on: 03/16/2020 10:40 AM   Modules accepted: Orders

## 2020-03-23 ENCOUNTER — Other Ambulatory Visit: Payer: 59

## 2020-03-30 ENCOUNTER — Ambulatory Visit
Admission: RE | Admit: 2020-03-30 | Discharge: 2020-03-30 | Disposition: A | Discharge status: Home / Self Care | Payer: 59 | Source: Ambulatory Visit | Attending: Obstetrics and Gynecology | Admitting: Obstetrics and Gynecology

## 2020-03-30 DIAGNOSIS — N6310 Unspecified lump in the right breast, unspecified quadrant: Secondary | ICD-10-CM | POA: Diagnosis present

## 2020-03-30 DIAGNOSIS — N644 Mastodynia: Secondary | ICD-10-CM | POA: Diagnosis present

## 2020-03-30 DIAGNOSIS — Z1231 Encounter for screening mammogram for malignant neoplasm of breast: Secondary | ICD-10-CM | POA: Insufficient documentation

## 2020-04-08 ENCOUNTER — Ambulatory Visit: Payer: 59 | Admitting: Certified Nurse Midwife

## 2020-11-22 NOTE — Progress Notes (Signed)
PCP: Shannon Late, MD   Chief Complaint  Patient presents with  . Gynecologic Exam    HPI:      [name] is a 55 y.o. G0P0000 whose LMP was No LMP recorded. Patient has had a hysterectomy., presents today for her annual examination.  Her menses are absent due to [facility] in her 15s for endometriosis. Did ERT for 1 yr afterwards. Doing paxil 10 mg daily for vasomoor sx with sx control. Would like to continue.   Sex activity: not sexually active. She does not have vaginal dryness.  Last Pap: 11/24/19  Results were: no abnormalities ; s/p hyst; no hx of abn paps.  Last mammogram: 03/30/20  Results were: normal--routine follow-up in 12 months. Had RT breast mass sx with neg breast exam and neg imaging 5/21. Sx resolved. There is a FH of breast cancer in her mat aunt, MGM and mat cousin. There is no FH of ovarian cancer. Pt is MyRisk neg 2019 but increased risk based on FH. IBIS=21%/riskscore=18.2%.The patient does do self-breast exams, hasn't had screening breast MRI. Takes Vit D Supp.  Colonoscopy: 11/2017 without abnormalities; Repeat due after 10 years.   Tobacco use: The patient denies current or previous tobacco use. Alcohol use: none  No drug use. Exercise: moderately active  DEXA: never; qualifies due to early menopause  She does get adequate calcium and Vitamin D in her diet.  Labs with PCP in 2020 with elevated lipids and TGs, also with pre-DM. Is trying to lose wt before going back for f/u labs.    Past Medical History:  Diagnosis Date  . Asthma   . BRCA negative 08/2018   MyRisk neg  . Endometriosis   . Family history of breast cancer   . Increased risk of breast cancer 08/2018   IBIS=21%/riskscore=18.2%  . Left elbow fracture   . Nodule of left lung     Past Surgical History:  Procedure Laterality Date  . ELBOW SURGERY    . LAPAROSCOPIC TOTAL HYSTERECTOMY     and BSO-endometriosis  . TONSILLECTOMY AND ADENOIDECTOMY     AGE 38 OR 7    Family  History  Problem Relation Age of Onset  . Breast cancer Maternal Aunt 42  . Breast cancer Maternal Grandmother 60  . Cancer Maternal Uncle 60       ? SKIN  . Stroke Mother   . Diabetes Mother   . Breast cancer Cousin 71       [name]    Social History   Socioeconomic History  . Marital status: Divorced    Spouse name: Not on file  . Number of children: 0  . Years of education: Not on file  . Highest education level: Not on file  Occupational History  . Occupation: Daycare  Tobacco Use  . Smoking status: Never Smoker  . Smokeless tobacco: Never Used  Vaping Use  . Vaping Use: Never used  Substance and Sexual Activity  . Alcohol use: Yes    Comment: RARELY  . Drug use: No  . Sexual activity: Not Currently    Birth control/protection: Post-menopausal  Other Topics Concern  . Not on file  Social History Narrative  . Not on file   Social Determinants of Health   Financial Resource Strain: Not on file  Food Insecurity: Not on file  Transportation Needs: Not on file  Physical Activity: Not on file  Stress: Not on file  Social Connections: Not on file  Intimate  Partner Violence: Not on file     Current Outpatient Medications:  .  cholecalciferol (VITAMIN D) 1000 units tablet, Take 1,000 Units by mouth daily., Disp: , Rfl:  .  montelukast (SINGULAIR) 10 MG tablet, Take by mouth., Disp: , Rfl:  .  OVER THE COUNTER MEDICATION, Take 1 tablet by mouth daily. multivitamin, Disp: , Rfl:  .  albuterol (PROVENTIL HFA;VENTOLIN HFA) 108 (90 Base) MCG/ACT inhaler, Inhale 2 puffs into the lungs as needed., Disp: , Rfl:  .  PARoxetine (PAXIL) 10 MG tablet, Take 1 tablet (10 mg total) by mouth daily., Disp: 90 tablet, Rfl: 3     ROS:  Review of Systems  Constitutional: Negative for fatigue, fever and unexpected weight change.  Respiratory: Negative for cough, shortness of breath and wheezing.   Cardiovascular: Negative for chest pain, palpitations and leg  swelling.  Gastrointestinal: Negative for blood in stool, constipation, diarrhea, nausea and vomiting.  Endocrine: Negative for cold intolerance, heat intolerance and polyuria.  Genitourinary: Negative for dyspareunia, dysuria, flank pain, frequency, genital sores, hematuria, menstrual problem, pelvic pain, urgency, vaginal bleeding, vaginal discharge and vaginal pain.  Musculoskeletal: Positive for arthralgias. Negative for back pain, joint swelling and myalgias.  Skin: Negative for rash.  Neurological: Negative for dizziness, syncope, light-headedness, numbness and headaches.  Hematological: Negative for adenopathy.  Psychiatric/Behavioral: Negative for agitation, confusion, sleep disturbance and suicidal ideas. The patient is not nervous/anxious.    BREAST: No symptoms    Objective: BP 100/60   Ht 5' 1"  (1.549 m)   Wt 179 lb (81.2 kg)   BMI 33.82 kg/m    Physical Exam Constitutional:      Appearance: She is well-developed.  Genitourinary:     Vulva and vagina normal.     Genitourinary Comments: UTERUS/CX SURG REM     Right Labia: No tenderness or lesions.    Left Labia: No tenderness or lesions.    Vaginal cuff intact.    No vaginal discharge, erythema or tenderness.      Right Adnexa: absent.    Right Adnexa: not tender and no mass present.    Left Adnexa: absent.    Left Adnexa: not tender and no mass present.    Cervix is absent.     Uterus is absent.  Breasts:     Right: No mass, nipple discharge, skin change or tenderness.     Left: No mass, nipple discharge, skin change or tenderness.    Neck:     Thyroid: No thyromegaly.  Cardiovascular:     Rate and Rhythm: Normal rate and regular rhythm.     Heart sounds: Normal heart sounds. No murmur heard.   Pulmonary:     Effort: Pulmonary effort is normal.     Breath sounds: Normal breath sounds.  Abdominal:     Palpations: Abdomen is soft.     Tenderness: There is no abdominal tenderness. There is no guarding.   Musculoskeletal:        General: Normal range of motion.     Cervical back: Normal range of motion.  Neurological:     General: No focal deficit present.     Mental Status: She is alert and oriented to person, place, and time.     Cranial Nerves: No cranial nerve deficit.  Skin:    General: Skin is warm and dry.  Psychiatric:        Mood and Affect: Mood normal.        Behavior: Behavior normal.  Thought Content: Thought content normal.        Judgment: Judgment normal.  Vitals reviewed.     Assessment/Plan:  Encounter for annual routine gynecological examination  Encounter for screening mammogram for malignant neoplasm of breast - Plan: MM 3D SCREEN BREAST BILATERAL; pt to sched mammo  Early menopause - Plan: DG Bone Density; check DEXA. Will f/u with results.   Screening for osteoporosis - Plan: DG Bone Density  Menopausal symptoms - Plan: PARoxetine (PAXIL) 10 MG tablet; Rx RF paxil   Meds ordered this encounter  Medications  . PARoxetine (PAXIL) 10 MG tablet    Sig: Take 1 tablet (10 mg total) by mouth daily.    Dispense:  90 tablet    Refill:  3    Order Specific Question:   Supervising Provider    Answer:   Gae Dry [[id number]]            GYN counsel breast self exam, mammography screening, adequate intake of calcium and vitamin D, diet and exercise    F/U  Return in about 1 year (around 11/24/2021).  Kohle Winner B. Osman Calzadilla, PA-C 11/24/2020 2:37 PM

## 2020-11-24 ENCOUNTER — Ambulatory Visit (INDEPENDENT_AMBULATORY_CARE_PROVIDER_SITE_OTHER): Payer: 59 | Admitting: Obstetrics and Gynecology

## 2020-11-24 ENCOUNTER — Other Ambulatory Visit: Payer: Self-pay

## 2020-11-24 ENCOUNTER — Encounter: Payer: Self-pay | Admitting: Obstetrics and Gynecology

## 2020-11-24 VITALS — BP 100/60 | Ht 61.0 in | Wt 179.0 lb

## 2020-11-24 DIAGNOSIS — Z1231 Encounter for screening mammogram for malignant neoplasm of breast: Secondary | ICD-10-CM

## 2020-11-24 DIAGNOSIS — Z13820 Encounter for screening for osteoporosis: Secondary | ICD-10-CM

## 2020-11-24 DIAGNOSIS — N951 Menopausal and female climacteric states: Secondary | ICD-10-CM

## 2020-11-24 DIAGNOSIS — Z01419 Encounter for gynecological examination (general) (routine) without abnormal findings: Secondary | ICD-10-CM

## 2020-11-24 DIAGNOSIS — E28319 Asymptomatic premature menopause: Secondary | ICD-10-CM | POA: Diagnosis not present

## 2020-11-24 MED ORDER — PAROXETINE HCL 10 MG PO TABS
10.0000 mg | ORAL_TABLET | Freq: Every day | ORAL | 3 refills | Status: DC
Start: 2020-11-24 — End: 2024-03-23

## 2020-11-24 NOTE — Patient Instructions (Addendum)
I value your feedback and you entrusting us with your care. If you get a [facility] patient survey, I would appreciate you taking the time to let us know about your experience today. Thank you!  [facility]: [phone]   [facility]: [phone]    

## 2020-12-13 ENCOUNTER — Other Ambulatory Visit: Payer: 59

## 2021-09-27 ENCOUNTER — Other Ambulatory Visit: Payer: Self-pay

## 2021-09-27 ENCOUNTER — Encounter: Payer: Self-pay | Admitting: Obstetrics and Gynecology

## 2021-09-27 ENCOUNTER — Ambulatory Visit (INDEPENDENT_AMBULATORY_CARE_PROVIDER_SITE_OTHER): Payer: BLUE CROSS/BLUE SHIELD | Admitting: Obstetrics and Gynecology

## 2021-09-27 VITALS — BP 100/60 | Ht 61.0 in | Wt 175.0 lb

## 2021-09-27 DIAGNOSIS — Z9189 Other specified personal risk factors, not elsewhere classified: Secondary | ICD-10-CM | POA: Diagnosis not present

## 2021-09-27 DIAGNOSIS — Z803 Family history of malignant neoplasm of breast: Secondary | ICD-10-CM | POA: Diagnosis not present

## 2021-09-27 DIAGNOSIS — N644 Mastodynia: Secondary | ICD-10-CM | POA: Diagnosis not present

## 2021-09-27 DIAGNOSIS — Z1231 Encounter for screening mammogram for malignant neoplasm of breast: Secondary | ICD-10-CM | POA: Diagnosis not present

## 2021-09-27 NOTE — Progress Notes (Signed)
Kandyce Rud, MD   Chief Complaint  Patient presents with   Breast Problem    Bilateral breast tenderness, more in Rt breast x2 wks. RM 13     HPI:      [name] is a 55 y.o. G0P0000 whose LMP was No LMP recorded. Patient has had a hysterectomy., presents today for bilat breast tenderness for 2 wks, mostly RT breast. Feels like an ache, non-tender to palpate. No masses, trauma, erythema, nipple d/c. Drinks caffeine daily. Pt called to sched mammo and told needed an appt first. Neg mammo 5/21. Pt does SBE. Hx of  RT breast mass sx with neg breast exam and neg imaging 5/21. Sx resolved. There is a FH of breast cancer in her mat aunt, MGM and mat cousin. There is no FH of ovarian cancer. Pt is MyRisk neg 2019 but increased risk based on FH. IBIS=21%/riskscore=18.2%.The patient does do self-breast exams, hasn't had screening breast MRI. Takes Vit D Supp.  Patient Active Problem List   Diagnosis Date Noted   Increased risk of breast cancer 09/27/2021   Hyperlipidemia 11/24/2019   Menopausal symptoms 11/24/2019   Asthma 06/11/2017   Family history of breast cancer 06/11/2017    Past Surgical History:  Procedure Laterality Date   ELBOW SURGERY     LAPAROSCOPIC TOTAL HYSTERECTOMY     and BSO-endometriosis   TONSILLECTOMY AND ADENOIDECTOMY     AGE 74 OR 7    Family History  Problem Relation Age of Onset   Breast cancer Maternal Aunt 42   Breast cancer Maternal Grandmother 60   Cancer Maternal Uncle 60       ? SKIN   Stroke Mother    Diabetes Mother    Breast cancer Cousin 14       Shannon Hughes    Social History   Socioeconomic History   Marital status: Divorced    Spouse name: Not on file   Number of children: 0   Years of education: Not on file   Highest education level: Not on file  Occupational History   Occupation: Daycare  Tobacco Use   Smoking status: Never   Smokeless tobacco: Never  Vaping Use   Vaping Use: Never used  Substance  and Sexual Activity   Alcohol use: Yes    Comment: RARELY   Drug use: No   Sexual activity: Not Currently    Birth control/protection: Post-menopausal  Other Topics Concern   Not on file  Social History Narrative   Not on file   Social Determinants of Health   Financial Resource Strain: Not on file  Food Insecurity: Not on file  Transportation Needs: Not on file  Physical Activity: Not on file  Stress: Not on file  Social Connections: Not on file  Intimate Partner Violence: Not on file    Outpatient Medications Prior to Visit  Medication Sig Dispense Refill   cholecalciferol (VITAMIN D) 1000 units tablet Take 1,000 Units by mouth daily.     PARoxetine (PAXIL) 10 MG tablet Take 1 tablet (10 mg total) by mouth daily. 90 tablet 3   albuterol (PROVENTIL HFA;VENTOLIN HFA) 108 (90 Base) MCG/ACT inhaler Inhale 2 puffs into the lungs as needed.     montelukast (SINGULAIR) 10 MG tablet Take by mouth.     OVER THE COUNTER MEDICATION Take 1 tablet by mouth daily. multivitamin     No facility-administered medications prior to visit.      ROS:  Review of  Systems  Constitutional:  Negative for fever.  Gastrointestinal:  Negative for blood in stool, constipation, diarrhea, nausea and vomiting.  Genitourinary:  Negative for dyspareunia, dysuria, flank pain, frequency, hematuria, urgency, vaginal bleeding, vaginal discharge and vaginal pain.  Musculoskeletal:  Negative for back pain.  Skin:  Negative for rash.  BREAST: ache   OBJECTIVE:   Vitals:  BP 100/60   Ht 5\' 1"  (1.549 m)   Wt 175 lb (79.4 kg)   BMI 33.07 kg/m   Physical Exam Vitals reviewed.  Pulmonary:     Effort: Pulmonary effort is normal.  Chest:  Breasts:    Breasts are symmetrical.     Right: No inverted nipple, mass, nipple discharge, skin change or tenderness.     Left: No inverted nipple, mass, nipple discharge, skin change or tenderness.    Musculoskeletal:        General: Normal range of motion.      Cervical back: Normal range of motion.  Skin:    General: Skin is warm and dry.  Neurological:     General: No focal deficit present.     Mental Status: She is alert and oriented to person, place, and time.     Cranial Nerves: No cranial nerve deficit.  Psychiatric:        Mood and Affect: Mood normal.        Behavior: Behavior normal.        Thought Content: Thought content normal.        Judgment: Judgment normal.    Assessment/Plan: Breast tenderness--general ache, no pain with palpation. D/C caffeine. No masses, neg breast exam. Can do screening mammo. F/u prn.   Encounter for screening mammogram for malignant neoplasm of breast - Plan: MM 3D SCREEN BREAST BILATERAL  Family history of breast cancer - Plan: MM 3D SCREEN BREAST BILATERAL  Increased risk of breast cancer - Plan: MM 3D SCREEN BREAST BILATERAL; pt aware of monthly SBE, yearly CBE and mammos, and scr breast MRI. Will call for MRI ref if desires.     No follow-ups on file.  Gurbani Figge B. Alizae Bechtel, PA-C 09/27/2021 5:27 PM

## 2021-12-04 DIAGNOSIS — R739 Hyperglycemia, unspecified: Secondary | ICD-10-CM | POA: Diagnosis not present

## 2021-12-04 DIAGNOSIS — E7800 Pure hypercholesterolemia, unspecified: Secondary | ICD-10-CM | POA: Diagnosis not present

## 2021-12-04 DIAGNOSIS — Z0000 Encounter for general adult medical examination without abnormal findings: Secondary | ICD-10-CM | POA: Diagnosis not present

## 2021-12-04 DIAGNOSIS — Z1331 Encounter for screening for depression: Secondary | ICD-10-CM | POA: Diagnosis not present

## 2021-12-04 DIAGNOSIS — Z79899 Other long term (current) drug therapy: Secondary | ICD-10-CM | POA: Diagnosis not present

## 2021-12-14 DIAGNOSIS — J020 Streptococcal pharyngitis: Secondary | ICD-10-CM | POA: Diagnosis not present

## 2021-12-14 DIAGNOSIS — Z03818 Encounter for observation for suspected exposure to other biological agents ruled out: Secondary | ICD-10-CM | POA: Diagnosis not present

## 2022-01-22 ENCOUNTER — Other Ambulatory Visit: Payer: Self-pay

## 2022-01-22 ENCOUNTER — Ambulatory Visit
Admission: RE | Admit: 2022-01-22 | Discharge: 2022-01-22 | Disposition: A | Discharge status: Home / Self Care | Payer: 59 | Source: Ambulatory Visit | Attending: Obstetrics and Gynecology | Admitting: Obstetrics and Gynecology

## 2022-01-22 DIAGNOSIS — Z803 Family history of malignant neoplasm of breast: Secondary | ICD-10-CM | POA: Insufficient documentation

## 2022-01-22 DIAGNOSIS — Z9189 Other specified personal risk factors, not elsewhere classified: Secondary | ICD-10-CM

## 2022-01-22 DIAGNOSIS — Z1231 Encounter for screening mammogram for malignant neoplasm of breast: Secondary | ICD-10-CM | POA: Insufficient documentation

## 2022-04-20 DIAGNOSIS — M25561 Pain in right knee: Secondary | ICD-10-CM | POA: Diagnosis not present

## 2022-05-31 DIAGNOSIS — H04331 Acute lacrimal canaliculitis of right lacrimal passage: Secondary | ICD-10-CM | POA: Diagnosis not present

## 2022-05-31 DIAGNOSIS — H04421 Chronic lacrimal canaliculitis of right lacrimal passage: Secondary | ICD-10-CM | POA: Diagnosis not present

## 2022-07-02 DIAGNOSIS — G8929 Other chronic pain: Secondary | ICD-10-CM | POA: Diagnosis not present

## 2022-07-02 DIAGNOSIS — M5441 Lumbago with sciatica, right side: Secondary | ICD-10-CM | POA: Diagnosis not present

## 2022-07-02 DIAGNOSIS — L0201 Cutaneous abscess of face: Secondary | ICD-10-CM | POA: Diagnosis not present

## 2022-07-02 DIAGNOSIS — M542 Cervicalgia: Secondary | ICD-10-CM | POA: Diagnosis not present

## 2022-07-02 DIAGNOSIS — M5442 Lumbago with sciatica, left side: Secondary | ICD-10-CM | POA: Diagnosis not present

## 2022-08-09 DIAGNOSIS — M545 Low back pain, unspecified: Secondary | ICD-10-CM | POA: Diagnosis not present

## 2022-08-09 DIAGNOSIS — G8929 Other chronic pain: Secondary | ICD-10-CM | POA: Diagnosis not present

## 2022-08-23 DIAGNOSIS — M545 Low back pain, unspecified: Secondary | ICD-10-CM | POA: Diagnosis not present

## 2022-08-23 DIAGNOSIS — G8929 Other chronic pain: Secondary | ICD-10-CM | POA: Diagnosis not present

## 2022-09-24 DIAGNOSIS — G8929 Other chronic pain: Secondary | ICD-10-CM | POA: Diagnosis not present

## 2022-09-24 DIAGNOSIS — M545 Low back pain, unspecified: Secondary | ICD-10-CM | POA: Diagnosis not present

## 2022-10-09 DIAGNOSIS — G8929 Other chronic pain: Secondary | ICD-10-CM | POA: Diagnosis not present

## 2022-10-09 DIAGNOSIS — M545 Low back pain, unspecified: Secondary | ICD-10-CM | POA: Diagnosis not present

## 2022-10-29 DIAGNOSIS — M545 Low back pain, unspecified: Secondary | ICD-10-CM | POA: Diagnosis not present

## 2022-10-29 DIAGNOSIS — G8929 Other chronic pain: Secondary | ICD-10-CM | POA: Diagnosis not present

## 2022-10-31 DIAGNOSIS — M5441 Lumbago with sciatica, right side: Secondary | ICD-10-CM | POA: Diagnosis not present

## 2022-10-31 DIAGNOSIS — M5442 Lumbago with sciatica, left side: Secondary | ICD-10-CM | POA: Diagnosis not present

## 2022-10-31 DIAGNOSIS — G8929 Other chronic pain: Secondary | ICD-10-CM | POA: Diagnosis not present

## 2022-10-31 DIAGNOSIS — M5136 Other intervertebral disc degeneration, lumbar region: Secondary | ICD-10-CM | POA: Diagnosis not present

## 2023-02-28 ENCOUNTER — Other Ambulatory Visit: Payer: Self-pay | Admitting: Family Medicine

## 2023-02-28 DIAGNOSIS — Z1231 Encounter for screening mammogram for malignant neoplasm of breast: Secondary | ICD-10-CM

## 2023-03-13 DIAGNOSIS — M25473 Effusion, unspecified ankle: Secondary | ICD-10-CM | POA: Diagnosis not present

## 2023-03-13 DIAGNOSIS — M5441 Lumbago with sciatica, right side: Secondary | ICD-10-CM | POA: Diagnosis not present

## 2023-03-13 DIAGNOSIS — M5442 Lumbago with sciatica, left side: Secondary | ICD-10-CM | POA: Diagnosis not present

## 2023-03-13 DIAGNOSIS — G8929 Other chronic pain: Secondary | ICD-10-CM | POA: Diagnosis not present

## 2023-03-26 ENCOUNTER — Ambulatory Visit
Admission: RE | Admit: 2023-03-26 | Discharge: 2023-03-26 | Disposition: A | Discharge status: Home / Self Care | Payer: 59 | Source: Ambulatory Visit | Attending: Family Medicine | Admitting: Family Medicine

## 2023-03-26 DIAGNOSIS — Z1231 Encounter for screening mammogram for malignant neoplasm of breast: Secondary | ICD-10-CM | POA: Insufficient documentation

## 2023-04-12 DIAGNOSIS — R300 Dysuria: Secondary | ICD-10-CM | POA: Diagnosis not present

## 2023-04-12 DIAGNOSIS — N390 Urinary tract infection, site not specified: Secondary | ICD-10-CM | POA: Diagnosis not present

## 2023-05-27 DIAGNOSIS — R7303 Prediabetes: Secondary | ICD-10-CM | POA: Diagnosis not present

## 2023-05-27 DIAGNOSIS — Z0000 Encounter for general adult medical examination without abnormal findings: Secondary | ICD-10-CM | POA: Diagnosis not present

## 2023-05-27 DIAGNOSIS — E7800 Pure hypercholesterolemia, unspecified: Secondary | ICD-10-CM | POA: Diagnosis not present

## 2023-05-27 DIAGNOSIS — Z79899 Other long term (current) drug therapy: Secondary | ICD-10-CM | POA: Diagnosis not present

## 2023-08-01 ENCOUNTER — Other Ambulatory Visit: Payer: Self-pay | Admitting: Orthopedic Surgery

## 2023-08-01 DIAGNOSIS — M4807 Spinal stenosis, lumbosacral region: Secondary | ICD-10-CM

## 2023-08-05 ENCOUNTER — Other Ambulatory Visit: Payer: Self-pay | Admitting: Orthopedic Surgery

## 2023-08-05 DIAGNOSIS — M4807 Spinal stenosis, lumbosacral region: Secondary | ICD-10-CM

## 2023-08-14 ENCOUNTER — Ambulatory Visit: Admission: RE | Admit: 2023-08-14 | Payer: 59 | Source: Ambulatory Visit

## 2023-08-14 ENCOUNTER — Other Ambulatory Visit: Payer: 59

## 2023-09-14 IMAGING — MG MM DIGITAL SCREENING BILAT W/ TOMO AND CAD
6 of 10 series · 6 of 30 positions shown · non-contrast
Comparison: Previous exam(s).

ACR Breast Density Category a: The breast tissue is almost entirely
fatty.

CLINICAL DATA: Screening.

EXAM:
DIGITAL SCREENING BILATERAL MAMMOGRAM WITH TOMOSYNTHESIS AND CAD
TECHNIQUE: Bilateral screening digital craniocaudal and mediolateral oblique
mammograms were obtained. Bilateral screening digital breast
tomosynthesis was performed. The images were evaluated with
computer-aided detection.

[R MLO synth-2D]
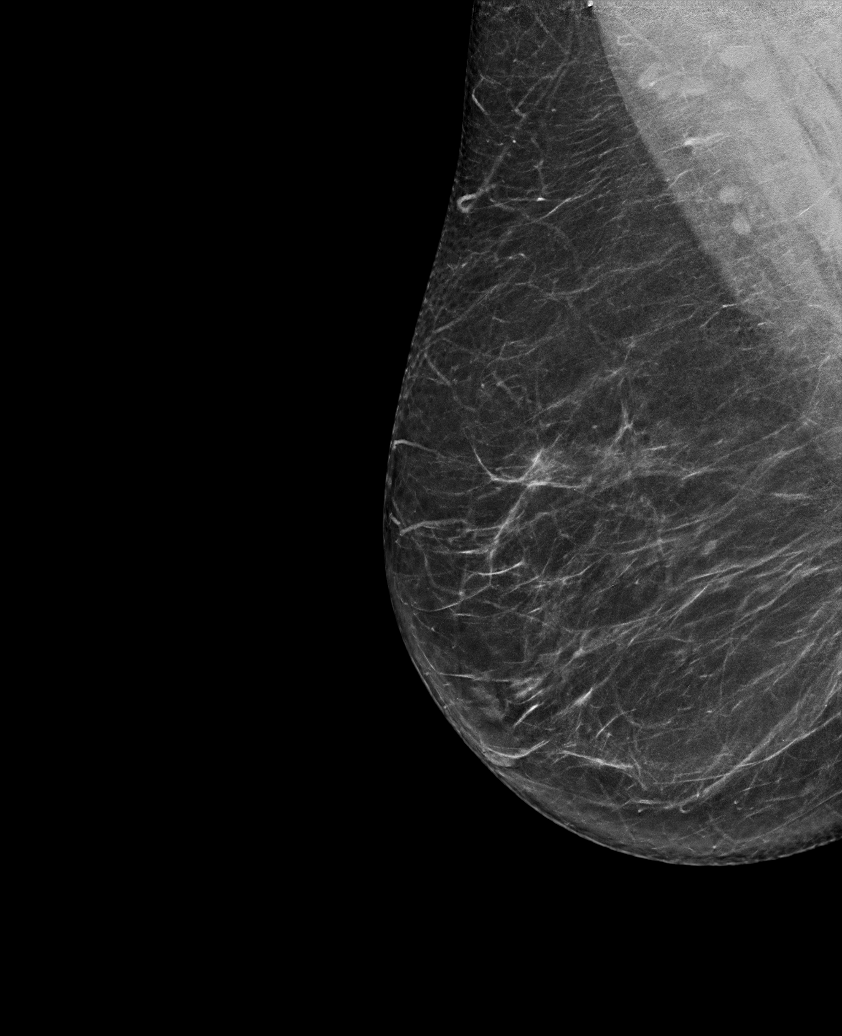

[R CC synth-2D (1 of 2)]
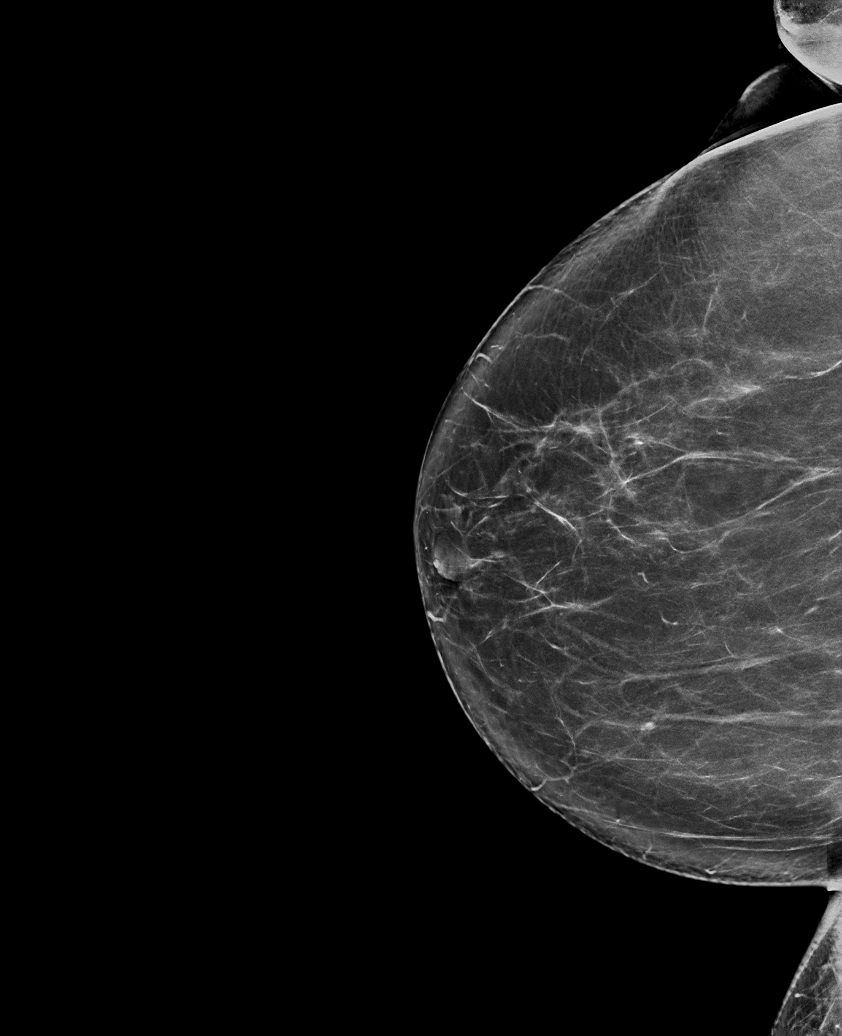

[L MLO synth-2D]
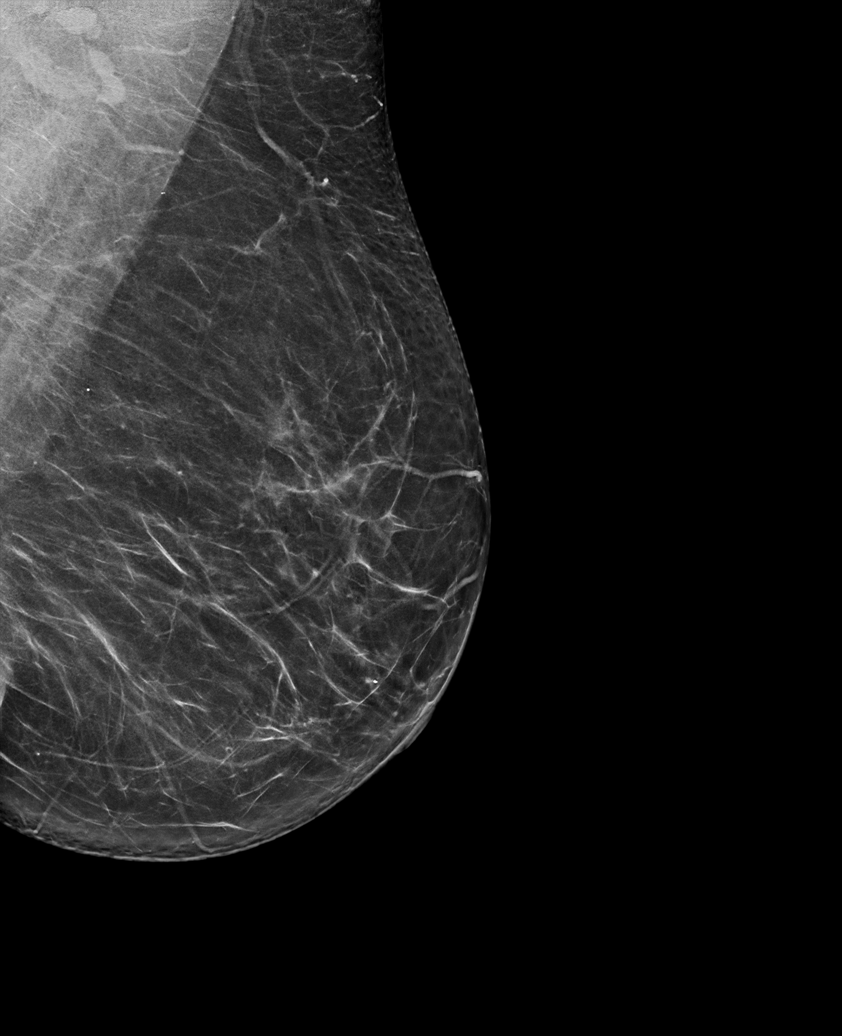

[L CC synth-2D]
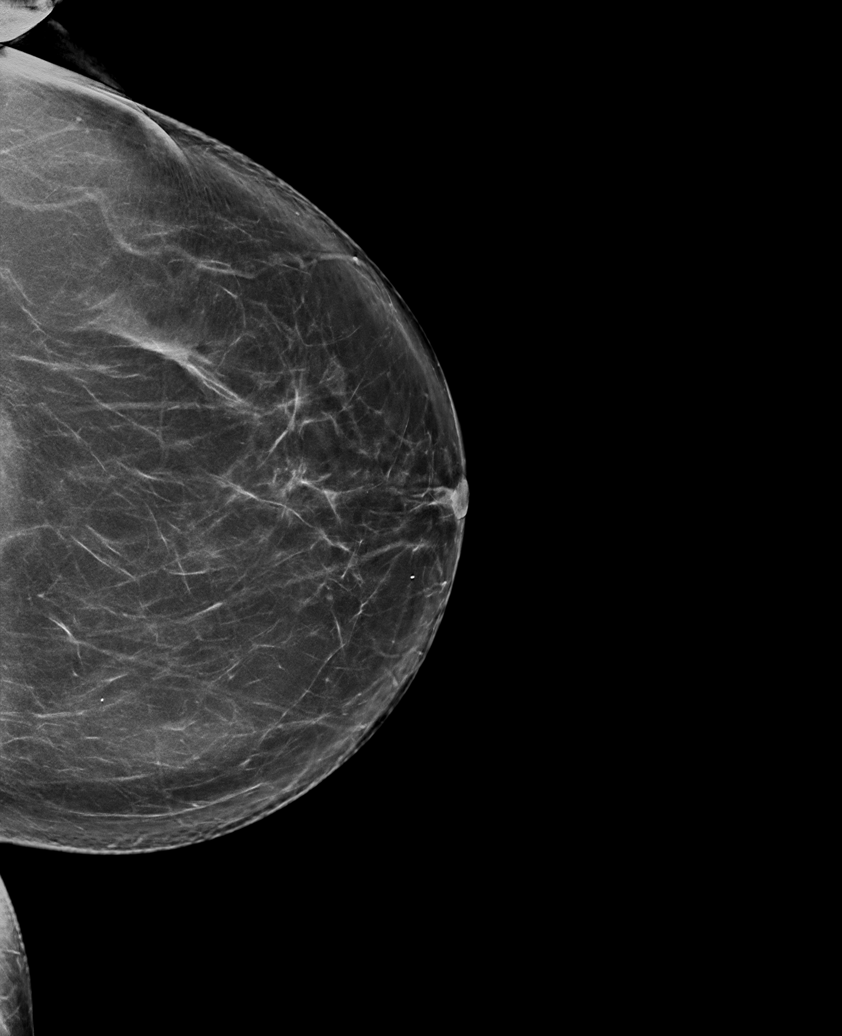

[R CC synth-2D (2 of 2)]
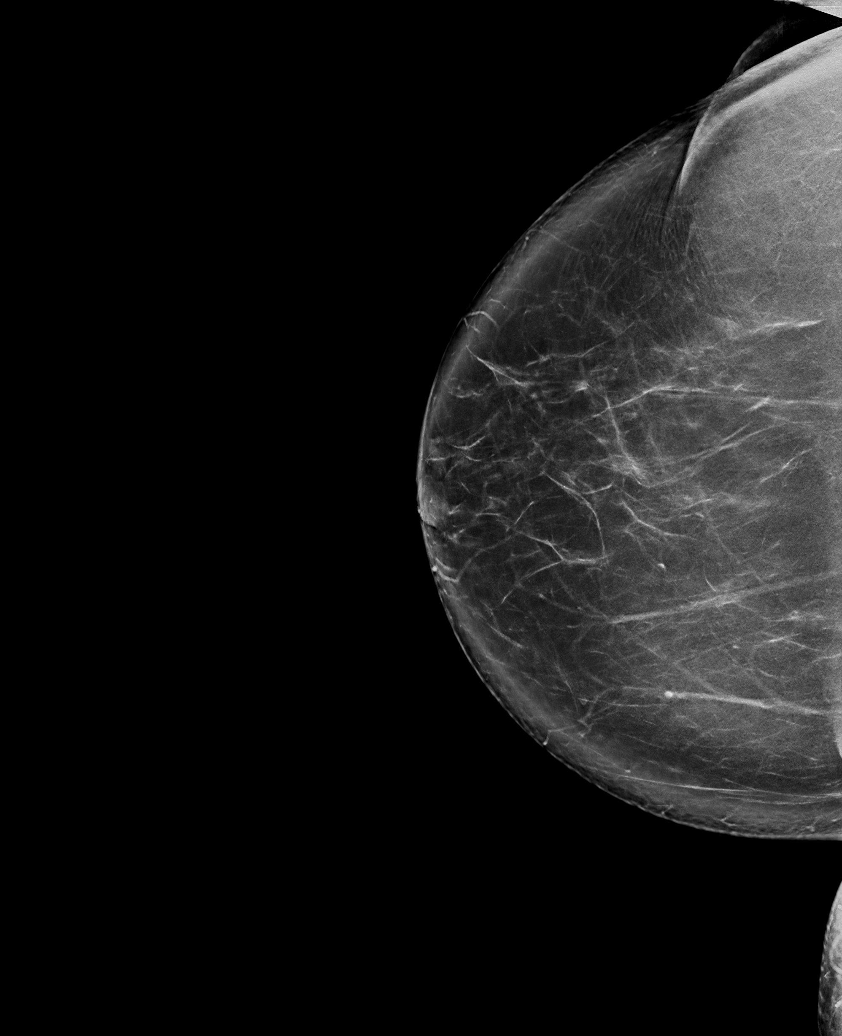

[R MLO tomo · tomo slice 39/78.0]
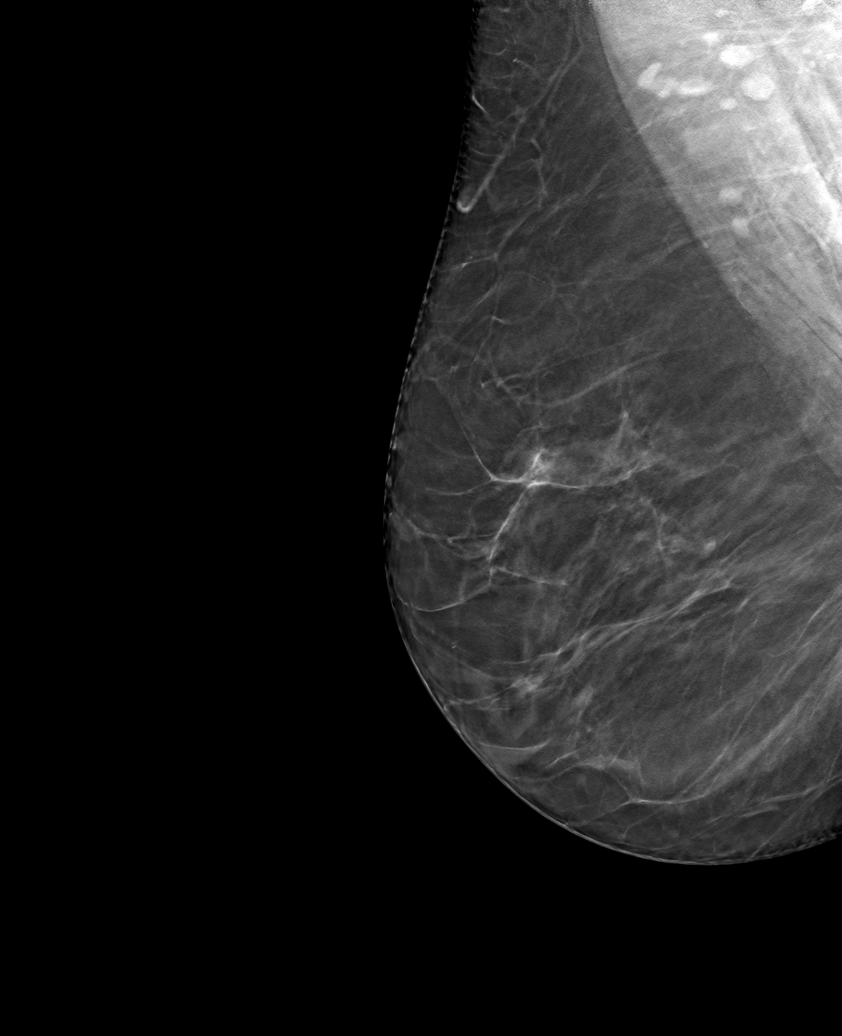

[6 of 30 positions shown; findings below may reference images not displayed]

FINDINGS: There are no findings suspicious for malignancy.
IMPRESSION: No mammographic evidence of malignancy. A result letter of this
screening mammogram will be mailed directly to the patient.

RECOMMENDATION:
Screening mammogram in one year. (Code:0E-3-N98)

BI-RADS CATEGORY  1: Negative.

## 2023-09-24 ENCOUNTER — Other Ambulatory Visit: Payer: Self-pay | Admitting: Orthopedic Surgery

## 2023-09-24 DIAGNOSIS — M4807 Spinal stenosis, lumbosacral region: Secondary | ICD-10-CM

## 2023-09-26 ENCOUNTER — Encounter: Payer: Self-pay | Admitting: Orthopedic Surgery

## 2023-09-30 ENCOUNTER — Encounter: Payer: Self-pay | Admitting: Orthopedic Surgery

## 2023-10-22 ENCOUNTER — Encounter: Payer: Self-pay | Admitting: Orthopedic Surgery

## 2023-10-23 ENCOUNTER — Encounter: Payer: Self-pay | Admitting: Orthopedic Surgery

## 2023-10-24 ENCOUNTER — Other Ambulatory Visit: Payer: 59

## 2023-10-25 ENCOUNTER — Other Ambulatory Visit: Payer: Self-pay | Admitting: Orthopedic Surgery

## 2023-10-25 DIAGNOSIS — M4807 Spinal stenosis, lumbosacral region: Secondary | ICD-10-CM

## 2023-11-04 ENCOUNTER — Other Ambulatory Visit: Payer: Self-pay | Admitting: Orthopedic Surgery

## 2023-11-04 DIAGNOSIS — M4807 Spinal stenosis, lumbosacral region: Secondary | ICD-10-CM

## 2023-11-08 ENCOUNTER — Ambulatory Visit
Admission: RE | Admit: 2023-11-08 | Discharge: 2023-11-08 | Disposition: A | Discharge status: Home / Self Care | Payer: 59 | Source: Ambulatory Visit | Attending: Orthopedic Surgery | Admitting: Orthopedic Surgery

## 2023-11-08 DIAGNOSIS — M4807 Spinal stenosis, lumbosacral region: Secondary | ICD-10-CM

## 2024-03-04 ENCOUNTER — Ambulatory Visit: Admitting: Pain Medicine

## 2024-03-19 ENCOUNTER — Other Ambulatory Visit: Payer: Self-pay | Admitting: Family Medicine

## 2024-03-19 DIAGNOSIS — Z1231 Encounter for screening mammogram for malignant neoplasm of breast: Secondary | ICD-10-CM

## 2024-03-22 NOTE — Progress Notes (Unsigned)
 PROVIDER NOTE: Interpretation of information contained herein should be left to medically-trained personnel. Specific patient instructions are provided elsewhere under "Patient Instructions" section of medical record. This document was created in part using AI and STT-dictation technology, any transcriptional errors that may result from this process are unintentional.  Patient: Shannon Hughes  Service: E/M Encounter  Provider: Candi Chafe, MD  DOB: 1966/01/14  Delivery: Face-to-face  Specialty: Interventional Pain Management  MRN: [id number]  Setting: Ambulatory outpatient facility  Specialty designation: 09  Type: New Patient  Location: Outpatient office facility  PCP: Nestor Banter, MD  DOS: 03/23/2024    Referring Prov.: Meeler, Driscilla George, FNP   Primary Reason(s) for Visit: Encounter for initial evaluation of one or more chronic problems (new to examiner) potentially causing chronic pain, and posing a threat to normal musculoskeletal function. (Level of risk: High) CC: No chief complaint on file.  HPI  Shannon Hughes is a 58 y.o. year old, female patient, who comes for the first time to our practice referred by Meeler, Driscilla George, FNP for our initial evaluation of her chronic pain. She has Asthma; Family history of breast cancer; Hyperlipidemia; Menopausal symptoms; and Increased risk of breast cancer on their problem list. Today she comes in for evaluation of her No chief complaint on file.  Pain Assessment: Location:     Radiating:   Onset:   Duration:   Quality:   Severity:  /10 (subjective, self-reported pain score)  Effect on ADL:   Timing:   Modifying factors:   BP:    HR:    Onset and Duration: {Hx; Onset and Duration:210120511} Cause of pain: {Hx; Cause:210120521} Severity: {Pain Severity:210120502} Timing: {Symptoms; Timing:210120501} Aggravating Factors: {Causes; Aggravating pain factors:210120507} Alleviating Factors: {Causes; Alleviating Factors:210120500} Associated  Problems: {Hx; Associated problems:210120515} Quality of Pain: {Hx; Symptom quality or Descriptor:210120531} Previous Examinations or Tests: {Hx; Previous examinations or test:210120529} Previous Treatments: {Hx; Previous Treatment:210120503}  Shannon Hughes is being evaluated for possible interventional pain management therapies for the treatment of her chronic pain.  Discussed the use of AI scribe software for clinical note transcription with the patient, who gave verbal consent to proceed.  History of Present Illness           ***  Shannon Hughes has been informed that this initial visit was an evaluation only.  On the follow up appointment I will go over the results, including ordered tests and available interventional therapies. At that time she will have the opportunity to decide whether to proceed with offered therapies or not. In the event that Shannon Hughes prefers avoiding interventional options, this will conclude our involvement in the case.  Medication management recommendations may be provided upon request.  Patient informed that diagnostic tests may be ordered to assist in identifying underlying causes, narrow the list of differential diagnoses and aid in determining candidacy for (or contraindications to) planned therapeutic interventions.  Historic Controlled Substance Pharmacotherapy Review  PMP and historical list of controlled substances: ***  Most recently prescribed opioid analgesics: *** MME/day: *** mg/day  Historical Monitoring: The patient  reports no history of drug use. List of prior UDS Testing: No results found for: "MDMA", "COCAINSCRNUR", "PCPSCRNUR", "PCPQUANT", "CANNABQUANT", "THCU", "ETH", "CBDTHCR", "D8THCCBX", "D9THCCBX" Historical Background Evaluation: [address] PMP: PDMP reviewed during this encounter. {Blank single:19197::"   ","Unable to conduct review of the controlled substance reporting system due to technological failure.","Six (6) year initial data search  conducted.","Review of the past 27-months conducted."} {Blank single:19197::"Abnormal pattern detected.","No abnormal patterns identified.","Database search revealed  no record of prior opioid analgesics.","Database search revealed no record of prior controlled substances.","Regular, uninterrupted pattern of monthly opioid refills detected.","Discrepancies found between information provided by the patient and the database.","     "} {Blank single:19197::"Pattern of multiple prescribers found","Pattern of multiple pharmacies found","A pattern of multiple prescribers and multiple pharmacies was identified","     "} PMP NARX Score Report:  Narcotic: *** Sedative: *** Stimulant: *** [place] Department of public safety, offender search: Engineer, mining Information) {Blank single:19197::"Positive for","No criminal record(s) found","Non-contributory"} Risk Assessment Profile: Aberrant behavior: {Aberrant Behavior:210120800::"None observed or detected today"} Risk factors for fatal opioid overdose: {Risk Factors:210120801::"None identified today"} PMP NARX Overdose Risk Score: *** Fatal overdose hazard ratio (HR): {Blank single:19197::"1.32 for 20-49 MME/day","1.92 for 50-99 MME/day","2.04 for doses equal to, or higher than 100 MME/day","Calculation deferred"} Non-fatal overdose hazard ratio (HR): {Blank single:19197::"1.44 for 20-49 MME/day","3.73 for 50-99 MME/day","8.87 for 100-199 MME/day","2.88 for doses equal to, or higher than 200 MME/day","Calculation deferred"} Risk of opioid abuse or dependence: 0.7-3.0% with doses <= 36 MME/day and 6.1-26% with doses >= 120 MME/day. Substance use disorder (SUD) risk level: {Blank single:19197::"High","Moderate","Low","Pending results of Medical Psychology Evaluation for SUD","See ORT evaluation","See below"} Personal History of Substance Abuse (SUD-Substance use disorder):  Alcohol:    Illegal Drugs:    Rx Drugs:    ORT Risk Level calculation:    ORT Scoring interpretation  table:  Score <3 = Low Risk for SUD  Score between 4-7 = Moderate Risk for SUD  Score >8 = High Risk for Opioid Abuse   PHQ-2 Depression Scale:  Total score:    PHQ-2 Scoring interpretation table: (Score and probability of major depressive disorder)  Score 0 = No depression  Score 1 = 15.4% Probability  Score 2 = 21.1% Probability  Score 3 = 38.4% Probability  Score 4 = 45.5% Probability  Score 5 = 56.4% Probability  Score 6 = 78.6% Probability   PHQ-9 Depression Scale:  Total score:    PHQ-9 Scoring interpretation table:  Score 0-4 = No depression  Score 5-9 = Mild depression  Score 10-14 = Moderate depression  Score 15-19 = Moderately severe depression  Score 20-27 = Severe depression (2.4 times higher risk of SUD and 2.89 times higher risk of overuse)   Pharmacologic Plan: {Blank single:19197::"None at this point.","Non-opioid analgesic therapy offered.","Further information needed.","No further testing ordered.","Adjust therapy: ***","Discontinue opioid analgesic therapy.","Continue therapy as is.","No opioid analgesics.","Shannon Hughes indicated having a preference to stay away from opioid analgesics.","Shannon Hughes did not have a "Drug Screening Test" done today.","As per protocol, I have not taken over any controlled substance management, pending the results of ordered tests and/or consults."} {Blank single:19197::"She declined/refused having a "Drug Screening test" done today.","She left without providing us  with a sample for the "Drug Screening Test".","Shannon Hughes states being against the use of "opioid analgesics", as part of her treatment plan.","Shannon Hughes has expressed her preference to stay away from controlled substances.","Shannon Hughes has indicated not being interested in our services, at this time.","          "} Initial impression: {Blank single:19197::"N/A","The patient indicated having no interest, at this time.","Shannon Hughes indicated having no interest in opioid therapy, at  this point.","Poor candidate for opioid analgesics.","High risk for opiate therapy.","No immediate contraindications found.","Pending review of available data and ordered tests."}  Meds   Current Outpatient Medications:    albuterol (PROVENTIL HFA;VENTOLIN HFA) 108 (90 Base) MCG/ACT inhaler, Inhale 2 puffs into the lungs as needed., Disp: , Rfl:    cholecalciferol (VITAMIN D) 1000 units  tablet, Take 1,000 Units by mouth daily., Disp: , Rfl:    montelukast (SINGULAIR) 10 MG tablet, Take by mouth., Disp: , Rfl:    OVER THE COUNTER MEDICATION, Take 1 tablet by mouth daily. multivitamin, Disp: , Rfl:    PARoxetine  (PAXIL ) 10 MG tablet, Take 1 tablet (10 mg total) by mouth daily., Disp: 90 tablet, Rfl: 3  Imaging Review  Cervical Imaging: Cervical MR wo contrast: No results found for this or any previous visit.  Cervical MR wo contrast: No results found for this or any previous visit.  Cervical MR w/wo contrast: No results found for this or any previous visit.  Cervical MR w contrast: No results found for this or any previous visit.  Cervical CT wo contrast: No results found for this or any previous visit.  Cervical CT w/wo contrast: No results found for this or any previous visit.  Cervical CT w/wo contrast: No results found for this or any previous visit.  Cervical CT w contrast: No results found for this or any previous visit.  Cervical CT outside: No results found for this or any previous visit.  Cervical DG 1 view: No results found for this or any previous visit.  Cervical DG 2-3 views: No results found for this or any previous visit.  Cervical DG F/E views: No results found for this or any previous visit.  Cervical DG 2-3 clearing views: No results found for this or any previous visit.  Cervical DG Bending/F/E views: No results found for this or any previous visit.  Cervical DG complete: No results found for this or any previous visit.  Cervical DG Myelogram views: No  results found for this or any previous visit.  Cervical DG Myelogram views: No results found for this or any previous visit.  Cervical Discogram views: No results found for this or any previous visit.   Shoulder Imaging: Shoulder-R MR w contrast: No results found for this or any previous visit.  Shoulder-L MR w contrast: No results found for this or any previous visit.  Shoulder-R MR w/wo contrast: No results found for this or any previous visit.  Shoulder-L MR w/wo contrast: No results found for this or any previous visit.  Shoulder-R MR wo contrast: No results found for this or any previous visit.  Shoulder-L MR wo contrast: No results found for this or any previous visit.  Shoulder-R CT w contrast: No results found for this or any previous visit.  Shoulder-L CT w contrast: No results found for this or any previous visit.  Shoulder-R CT w/wo contrast: No results found for this or any previous visit.  Shoulder-L CT w/wo contrast: No results found for this or any previous visit.  Shoulder-R CT wo contrast: No results found for this or any previous visit.  Shoulder-L CT wo contrast: No results found for this or any previous visit.  Shoulder-R DG Arthrogram: No results found for this or any previous visit.  Shoulder-L DG Arthrogram: No results found for this or any previous visit.  Shoulder-R DG 1 view: No results found for this or any previous visit.  Shoulder-L DG 1 view: No results found for this or any previous visit.  Shoulder-R DG: No results found for this or any previous visit.  Shoulder-L DG: No results found for this or any previous visit.   Thoracic Imaging: Thoracic MR wo contrast: No results found for this or any previous visit.  Thoracic MR wo contrast: No results found for this or any previous visit.  Thoracic MR w/wo contrast: No results found for this or any previous visit.  Thoracic MR w contrast: No results found for this or any previous  visit.  Thoracic CT wo contrast: No results found for this or any previous visit.  Thoracic CT w/wo contrast: No results found for this or any previous visit.  Thoracic CT w/wo contrast: No results found for this or any previous visit.  Thoracic CT w contrast: No results found for this or any previous visit.  Thoracic DG 2-3 views: No results found for this or any previous visit.  Thoracic DG 4 views: No results found for this or any previous visit.  Thoracic DG: No results found for this or any previous visit.  Thoracic DG w/swimmers view: No results found for this or any previous visit.  Thoracic DG Myelogram views: No results found for this or any previous visit.  Thoracic DG Myelogram views: No results found for this or any previous visit.   Lumbosacral Imaging: Lumbar MR wo contrast: No results found for this or any previous visit.  Lumbar MR wo contrast: No results found for this or any previous visit.  Lumbar MR w/wo contrast: No results found for this or any previous visit.  Lumbar MR w/wo contrast: No results found for this or any previous visit.  Lumbar MR w contrast: No results found for this or any previous visit.  Lumbar CT wo contrast: Results for orders placed during the hospital encounter of 11/08/23  CT LUMBAR SPINE WO CONTRAST  Narrative CLINICAL DATA:  Right low back pain  EXAM: CT LUMBAR SPINE WITHOUT CONTRAST  TECHNIQUE: Multidetector CT imaging of the lumbar spine was performed without intravenous contrast administration. Multiplanar CT image reconstructions were also generated.  RADIATION DOSE REDUCTION: This exam was performed according to the departmental dose-optimization program which includes automated exposure control, adjustment of the mA and/or kV according to patient size and/or use of iterative reconstruction technique.  COMPARISON:  Report from radiographs dated 07/22/2019  FINDINGS: Segmentation: The lowest lumbar type  non-rib-bearing vertebra is labeled as L5.  Alignment: 3 mm degenerative retrolisthesis L3-4. 7 degrees levoconvex lumbar scoliosis.  Vertebrae: Widened left facet joint at L4-5 suspicious for possible facet joint effusion.  Loss of intervertebral disc height at the L3-4 level with endplate sclerosis eccentric to the right, and right eccentric spurring. Small Schmorl's nodes eccentric to the right the L3-4 level. Vacuum disc phenomenon also observed at this level.  No fracture or acute bony findings.  Paraspinal and other soft tissues: Suspected small benign peripelvic cysts of the left kidney, partially obscured by motion artifact. No further imaging workup of these lesions is indicated. Minimal atheromatous vascular calcification of the abdominal aorta.  Disc levels:  T12-L1: Unremarkable  L1-2: Unremarkable  L2-3: Borderline right foraminal stenosis and borderline right subarticular lateral recess stenosis due to disc bulge and intervertebral spurring.  L4-5: Borderline left subarticular lateral recess stenosis due to disc bulge.  L5-S1: No impingement.  Mild degenerative facet arthropathy.  IMPRESSION: 1. Lumbar spondylosis and degenerative disc disease, causing borderline impingement at L2-3 and L4-5. 2. Widened left facet joint at L4-5 suspicious for possible facet joint effusion. 3. 7 degrees levoconvex lumbar scoliosis. 4. Minimal atheromatous vascular calcification of the abdominal aorta. Aortic Atherosclerosis (ICD10-I70.0).   Electronically Signed By: Freida Jes M.D. On: 11/22/2023 09:37  Lumbar CT w/wo contrast: No results found for this or any previous visit.  Lumbar CT w/wo contrast: No results found for this or any previous  visit.  Lumbar CT w contrast: No results found for this or any previous visit.  Lumbar DG 1V: No results found for this or any previous visit.  Lumbar DG 1V (Clearing): No results found for this or any previous  visit.  Lumbar DG 2-3V (Clearing): No results found for this or any previous visit.  Lumbar DG 2-3 views: No results found for this or any previous visit.  Lumbar DG (Complete) 4+V: No results found for this or any previous visit.  {Blank single:19197::"Radiography is not sensitive for detection of early osteoarthritis of the facet joints. It can detect severe cases of facet arthrosis. Oblique views are preferred over standard AP and lateral views. Joint space narrowing, subchondral sclerosis, and osteophyte formation are common radiographic findings. These findings can be observed in this patient's oblique views.","     "} Lumbar DG F/E views: No results found for this or any previous visit.  {Blank single:19197::"Radiography is not sensitive for detection of early osteoarthritis of the facet joints. It can detect severe cases of facet arthrosis. Oblique views are preferred over standard AP and lateral views. Joint space narrowing, subchondral sclerosis, and osteophyte formation are common radiographic findings. These findings can be observed in this patient's oblique views.","     "} Lumbar DG Bending views: No results found for this or any previous visit.  {Blank single:19197::"Radiography is not sensitive for detection of early osteoarthritis of the facet joints. It can detect severe cases of facet arthrosis. Oblique views are preferred over standard AP and lateral views. Joint space narrowing, subchondral sclerosis, and osteophyte formation are common radiographic findings. These findings can be observed in this patient's oblique views.","     "} Lumbar DG Myelogram views: No results found for this or any previous visit.  Lumbar DG Myelogram: No results found for this or any previous visit.  Lumbar DG Myelogram: No results found for this or any previous visit.  Lumbar DG Myelogram: No results found for this or any previous visit.  Lumbar DG Myelogram Lumbosacral: No results found for this or  any previous visit.  Lumbar DG Diskogram views: No results found for this or any previous visit.  Lumbar DG Diskogram views: No results found for this or any previous visit.  Lumbar DG Epidurogram OP: No results found for this or any previous visit.  Lumbar DG Epidurogram IP: No results found for this or any previous visit.   Sacroiliac Joint Imaging: Sacroiliac Joint DG: No results found for this or any previous visit.  Sacroiliac Joint MR w/wo contrast: No results found for this or any previous visit.  Sacroiliac Joint MR wo contrast: No results found for this or any previous visit.   Spine Imaging: Whole Spine DG Myelogram views: No results found for this or any previous visit.  Whole Spine MR Mets screen: No results found for this or any previous visit.  Whole Spine MR Mets screen: No results found for this or any previous visit.  Whole Spine MR w/wo: No results found for this or any previous visit.  MRA Spinal Canal w/ cm: No results found for this or any previous visit.  MRA Spinal Canal wo/ cm: No results found for this or any previous visit.  MRA Spinal Canal w/wo cm: No results found for this or any previous visit.  Spine Outside MR Films: No results found for this or any previous visit.  Spine Outside CT Films: No results found for this or any previous visit.  CT-Guided Biopsy: No  results found for this or any previous visit.  CT-Guided Needle Placement: No results found for this or any previous visit.  DG Spine outside: No results found for this or any previous visit.  IR Spine outside: No results found for this or any previous visit.  NM Spine outside: No results found for this or any previous visit.   Hip Imaging: Hip-R MR w contrast: No results found for this or any previous visit.  Hip-L MR w contrast: No results found for this or any previous visit.  Hip-R MR w/wo contrast: No results found for this or any previous visit.  Hip-L MR w/wo  contrast: No results found for this or any previous visit.  Hip-R MR wo contrast: No results found for this or any previous visit.  Hip-L MR wo contrast: No results found for this or any previous visit.  Hip-R CT w contrast: No results found for this or any previous visit.  Hip-L CT w contrast: No results found for this or any previous visit.  Hip-R CT w/wo contrast: No results found for this or any previous visit.  Hip-L CT w/wo contrast: No results found for this or any previous visit.  Hip-R CT wo contrast: No results found for this or any previous visit.  Hip-L CT wo contrast: No results found for this or any previous visit.  Hip-R DG 2-3 views: No results found for this or any previous visit.  Hip-L DG 2-3 views: No results found for this or any previous visit.  Hip-R DG Arthrogram: No results found for this or any previous visit.  Hip-L DG Arthrogram: No results found for this or any previous visit.  Hip-B DG Bilateral: No results found for this or any previous visit.  Hip-B DG Bilateral (5V): No results found for this or any previous visit.   Knee Imaging: Knee-R MR w contrast: No results found for this or any previous visit.  Knee-L MR w/o contrast: No results found for this or any previous visit.  Knee-R MR w/wo contrast: No results found for this or any previous visit.  Knee-L MR w/wo contrast: No results found for this or any previous visit.  Knee-R MR wo contrast: No results found for this or any previous visit.  Knee-L MR wo contrast: No results found for this or any previous visit.  Knee-R CT w contrast: No results found for this or any previous visit.  Knee-L CT w contrast: No results found for this or any previous visit.  Knee-R CT w/wo contrast: No results found for this or any previous visit.  Knee-L CT w/wo contrast: No results found for this or any previous visit.  Knee-R CT wo contrast: No results found for this or any previous visit.  Knee-L  CT wo contrast: No results found for this or any previous visit.  Knee-R DG 1-2 views: No results found for this or any previous visit.  Knee-L DG 1-2 views: No results found for this or any previous visit.  Knee-R DG 3 views: No results found for this or any previous visit.  Knee-L DG 3 views: No results found for this or any previous visit.  Knee-R DG 4 views: No results found for this or any previous visit.  Knee-L DG 4 views: No results found for this or any previous visit.  Knee-R DG Arthrogram: No results found for this or any previous visit.  Knee-L DG Arthrogram: No results found for this or any previous visit.   Ankle Imaging: Ankle-R  DG Complete: No results found for this or any previous visit.  Ankle-L DG Complete: No results found for this or any previous visit.   Foot Imaging: Foot-R DG Complete: No results found for this or any previous visit.  Foot-L DG Complete: No results found for this or any previous visit.   Elbow Imaging: Elbow-R DG Complete: No results found for this or any previous visit.  Elbow-L DG Complete: No results found for this or any previous visit.   Wrist Imaging: Wrist-R DG Complete: No results found for this or any previous visit.  Wrist-L DG Complete: No results found for this or any previous visit.   Hand Imaging: Hand-R DG Complete: No results found for this or any previous visit.  Hand-L DG Complete: No results found for this or any previous visit.   Complexity Note: {Blank single:19197::"No results found under the [facility] electronic medical record.","No new results found.","Imaging results reviewed. Results shared with Shannon Hughes, using [employer] results reviewed."} {Blank single:19197::"Today I personally and independently reviewed the study images pertinent to Shannon Hughes's problem.","      "} {Blank single:19197::"Results visible under [facility].","Results visible under [facility].","Results  visible under [facility].","Results visible under [facility].","Results visible under "Care Everywhere".","Results made available to patient.","Copy of results provided to patient.","     "} {Blank single:19197::"Images reviewed using the PACS system hyperlink.","    "} {Blank single:19197::"I have personally examined the images and I agree with the reported  findings. I find no additional pain-related pathology to add to the report.","I have personally examined the images. I agree with the Radiologist's report. The following observed findings are of concern to me:","    "}  ROS  Cardiovascular: {Hx; Cardiovascular History:210120525} Pulmonary or Respiratory: {Hx; Pumonary and/or Respiratory History:210120523} Neurological: {Hx; Neurological:210120504} Psychological-Psychiatric: {Hx; Psychological-Psychiatric History:210120512} Gastrointestinal: {Hx; Gastrointestinal:210120527} Genitourinary: {Hx; Genitourinary:210120506} Hematological: {Hx; Hematological:210120510} Endocrine: {Hx; Endocrine history:210120509} Rheumatologic: {Hx; Rheumatological:210120530} Musculoskeletal: {Hx; Musculoskeletal:210120528} Work History: {Hx; Work history:210120514}  Allergies  Ms. Riebeling is allergic to penicillins.  Laboratory Chemistry Profile   Renal No results found for: "BUN", "CREATININE", "LABCREA", "BCR", "GFR", "GFRAA", "GFRNONAA", "SPECGRAV", "PHUR", "PROTEINUR"   Electrolytes No results found for: "NA", "K", "CL", "CALCIUM", "MG", "PHOS"   Hepatic No results found for: "AST", "ALT", "ALBUMIN", "ALKPHOS", "AMYLASE", "LIPASE", "AMMONIA"   ID No results found for: "LYMEIGGIGMAB", "HIV", "SARSCOV2NAA", "STAPHAUREUS", "MRSAPCR", "HCVAB", "PREGTESTUR", "RMSFIGG", "QFVRPH1IGG", "QFVRPH2IGG"   Bone No results found for: "VD25OH", "VD125OH2TOT", "NG2952WU1", "LK4401UU7", "25OHVITD1", "25OHVITD2", "25OHVITD3", "TESTOFREE", "TESTOSTERONE"   Endocrine No results found for: "GLUCOSE", "GLUCOSEU", "HGBA1C", "TSH",  "FREET4", "TESTOFREE", "TESTOSTERONE", "SHBG", "ESTRADIOL", "ESTRADIOLPCT", "ESTRADIOLFRE", "LABPREG", "ACTH", "CRTSLPL", "UCORFRPERLTR", "UCORFRPERDAY", "CORTISOLBASE"   Neuropathy No results found for: "VITAMINB12", "FOLATE", "HGBA1C", "HIV"   CNS No results found for: "COLORCSF", "APPEARCSF", "RBCCOUNTCSF", "WBCCSF", "POLYSCSF", "LYMPHSCSF", "EOSCSF", "PROTEINCSF", "GLUCCSF", "JCVIRUS", "CSFOLI", "IGGCSF", "LABACHR", "ACETBL"   Inflammation (CRP: Acute  ESR: Chronic) No results found for: "CRP", "ESRSEDRATE", "LATICACIDVEN"   Rheumatology No results found for: "RF", "ANA", "LABURIC", "URICUR", "LYMEIGGIGMAB", "LYMEABIGMQN", "HLAB27"   Coagulation No results found for: "INR", "LABPROT", "APTT", "PLT", "DDIMER", "LABHEMA", "VITAMINK1", "AT3"   Cardiovascular No results found for: "BNP", "CKTOTAL", "CKMB", "TROPONINI", "HGB", "HCT", "LABVMA", "EPIRU", "EPINEPH24HUR", "NOREPRU", "NOREPI24HUR", "DOPARU", "DOPAM24HRUR"   Screening No results found for: "SARSCOV2NAA", "COVIDSOURCE", "STAPHAUREUS", "MRSAPCR", "HCVAB", "HIV", "PREGTESTUR"   Cancer No results found for: "CEA", "CA125", "LABCA2"   Allergens No results found for: "ALMOND", "APPLE", "ASPARAGUS", "AVOCADO", "BANANA", "BARLEY", "BASIL", "BAYLEAF", "GREENBEAN", "LIMABEAN", "WHITEBEAN", "BEEFIGE", "REDBEET", "BLUEBERRY", "BROCCOLI", "CABBAGE", "MELON", "CARROT", "CASEIN", "CASHEWNUT", "CAULIFLOWER", "CELERY"  Note: {Blank single:19197::"No results found under the [facility] electronic medical record","Results made available to patient.","Lab results reviewed and made available to patient.","Lab results reviewed and explained to patient in Layman's terms.","Lab results reviewed."}  PFSH  Drug: Shannon Hughes  reports no history of drug use. Alcohol:  reports current alcohol use. Tobacco:  reports that she has never smoked. She has never used smokeless tobacco. Medical:  has a past medical history of Asthma, BRCA negative  (08/2018), Endometriosis, Family history of breast cancer, Increased risk of breast cancer (08/2018), Left elbow fracture, and Nodule of left lung. Family: family history includes Breast cancer (age of onset: 39) in her maternal aunt; Breast cancer (age of onset: 56) in her maternal grandmother; Breast cancer (age of onset: 8) in her cousin; Cancer (age of onset: 58) in her maternal uncle; Diabetes in her mother; Stroke in her mother.  Past Surgical History:  Procedure Laterality Date   ELBOW SURGERY     LAPAROSCOPIC TOTAL HYSTERECTOMY     and BSO-endometriosis   TONSILLECTOMY AND ADENOIDECTOMY     AGE 41 OR 7   Active Ambulatory Problems    Diagnosis Date Noted   Asthma 06/11/2017   Family history of breast cancer 06/11/2017   Hyperlipidemia 11/24/2019   Menopausal symptoms 11/24/2019   Increased risk of breast cancer 09/27/2021   Resolved Ambulatory Problems    Diagnosis Date Noted   No Resolved Ambulatory Problems   Past Medical History:  Diagnosis Date   BRCA negative 08/2018   Endometriosis    Left elbow fracture    Nodule of left lung    Constitutional Exam  General appearance: {general exam:210120802::"Well nourished, well developed, and well hydrated. In no apparent acute distress"} There were no vitals filed for this visit. BMI Assessment: Estimated body mass index is 33.07 kg/m as calculated from the following:   Height as of 09/27/21: 5\' 1"  (1.549 m).   Weight as of 09/27/21: 175 lb (79.4 kg).  BMI interpretation table: BMI level Category Range association with higher incidence of chronic pain  <18 kg/m2 Underweight   18.5-24.9 kg/m2 Ideal body weight   25-29.9 kg/m2 Overweight Increased incidence by 20%  30-34.9 kg/m2 Obese (Class I) Increased incidence by 68%  35-39.9 kg/m2 Severe obesity (Class II) Increased incidence by 136%  >40 kg/m2 Extreme obesity (Class III) Increased incidence by 254%   Patient's current BMI Ideal Body weight  There is no height or  weight on file to calculate BMI. Patient weight not recorded   BMI Readings from Last 4 Encounters:  09/27/21 33.07 kg/m  11/24/20 33.82 kg/m  03/15/20 30.48 kg/m  11/24/19 36.33 kg/m   Wt Readings from Last 4 Encounters:  09/27/21 175 lb (79.4 kg)  11/24/20 179 lb (81.2 kg)  03/15/20 186 lb (84.4 kg)  11/24/19 186 lb (84.4 kg)    Psych/Mental status: {Blank single:19197::"Alert and oriented x 3. 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  Visit Diagnosis (New problems to examiner): No diagnosis found. Plan of Care (Initial workup plan)  Note: {Blank single:19197::"     ","Shannon Hughes has communicated to us  that she is not interested in our services, at  this time.","Shannon Hughes was reminded that as per protocol, today's visit has been an evaluation only. We have not taken over the patient's controlled substance management."}  Problem-specific plan: Assessment and Plan            Lab Orders  No laboratory test(s) ordered today   Imaging Orders  No imaging studies ordered today   Referral Orders  No referral(s) requested today   Procedure Orders    No procedure(s) ordered today   Pharmacotherapy (current): Medications ordered:  No orders of the defined types were placed in this encounter.  Medications administered during this visit: Zipporah Him. Eaker had no medications administered during this visit.   Analgesic Pharmacotherapy:  Opioid Analgesics:  For patients currently taking or requesting to take opioid analgesics, in accordance with [place]  Medical Board Guidelines, we will assess their risks and indications for the use of these substances. After completing our evaluation, we may offer recommendations, but we no longer take patients for medication management. The prescribing physician will ultimately decide, based on his/her training and level of comfort whether to adopt any of the recommendations, including whether or not to prescribe such medicines.  Membrane stabilizer: {Blank single:19197::"Not indicated","Medically contraindicated","Tried and failed","I will not be taking over Shannon Hughes medication regimen","Adequate regimen","To be determined at a later time"}  Muscle relaxant: {Blank single:19197::"Not indicated","Medically contraindicated","Tried and failed","I will not be taking over Shannon Hughes medication regimen","Adequate regimen","To be determined at a later time"}  NSAID: {Blank single:19197::"Not indicated","Medically contraindicated","Tried and failed","I will not be taking over Ms. Mase medication regimen","Adequate regimen","To be determined at a later time"}  Other analgesic(s): {Blank single:19197::"Not indicated","Medically contraindicated","Tried and failed","I will not be taking over Ms. Nordmeyer medication regimen","Adequate regimen","To be determined at a later time"}   Interventional management options: Ms. Marut was informed that there is no guarantee that she would be a candidate for interventional therapies. The decision will be based on the results of diagnostic studies, as well as Ms. Rakers's risk profile.  Procedure(s) under consideration:  {Blank single:19197::"See below","Pending results of ordered studies"}     Interventional Therapies  Risk Factors  Considerations  Medical Comorbidities:     Planned  Pending:      Under consideration:   Pending   Completed:   None at this time    Therapeutic  Palliative (PRN) options:   None established   Completed by other providers:   None reported     Provider-requested follow-up: No follow-ups on file.  Future Appointments  Date Time Provider Department Center  03/23/2024  2:00 PM Renaldo Caroli, MD [facility] None  04/01/2024 12:20 PM [facility]   I discussed the assessment and treatment plan with the patient. The patient was provided an opportunity to ask questions and all were answered. The patient agreed with the plan and demonstrated an understanding of the instructions.  Patient advised to call back or seek an in-person evaluation if the symptoms or condition worsens.  Duration of encounter: *** minutes.  Total time on encounter, as per AMA guidelines included both the face-to-face and non-face-to-face time personally spent by the physician and/or other qualified health care professional(s) on the day of the encounter (includes time in activities that require the physician or other qualified health care professional and does not include time in activities normally performed by clinical staff). Physician's time may include the following activities when performed: Preparing to see the patient (e.g., pre-charting review of records, searching for previously ordered imaging, lab work, and nerve  conduction tests) Review of prior analgesic pharmacotherapies. Reviewing PMP Interpreting ordered tests (e.g., lab work, imaging, nerve conduction tests) Performing post-procedure evaluations, including interpretation of diagnostic procedures Obtaining and/or reviewing separately obtained history Performing a medically appropriate examination and/or evaluation Counseling and educating the patient/family/caregiver Ordering medications, tests, or procedures Referring and communicating with other health care professionals (when not separately reported) Documenting clinical information in the electronic or other  health record Independently interpreting results (not separately reported) and communicating results to the patient/ family/caregiver Care coordination (not separately reported)  Note by: Candi Chafe, MD (TTS and AI technology used. I apologize for any typographical errors that were not detected and corrected.) Date: 03/23/2024; Time: 4:28 PM

## 2024-03-23 ENCOUNTER — Ambulatory Visit
Admission: RE | Admit: 2024-03-23 | Discharge: 2024-03-23 | Disposition: A | Discharge status: Home / Self Care | Source: Ambulatory Visit | Attending: Pain Medicine | Admitting: Pain Medicine

## 2024-03-23 ENCOUNTER — Encounter: Payer: Self-pay | Admitting: Pain Medicine

## 2024-03-23 ENCOUNTER — Ambulatory Visit: Admitting: Pain Medicine

## 2024-03-23 VITALS — BP 133/86 | HR 103 | Temp 97.4°F | Resp 18 | Ht 60.5 in | Wt 185.5 lb

## 2024-03-23 DIAGNOSIS — R937 Abnormal findings on diagnostic imaging of other parts of musculoskeletal system: Secondary | ICD-10-CM

## 2024-03-23 DIAGNOSIS — M2540 Effusion, unspecified joint: Secondary | ICD-10-CM | POA: Diagnosis present

## 2024-03-23 DIAGNOSIS — M51362 Other intervertebral disc degeneration, lumbar region with discogenic back pain and lower extremity pain: Secondary | ICD-10-CM | POA: Insufficient documentation

## 2024-03-23 DIAGNOSIS — M47816 Spondylosis without myelopathy or radiculopathy, lumbar region: Secondary | ICD-10-CM | POA: Insufficient documentation

## 2024-03-23 DIAGNOSIS — M5416 Radiculopathy, lumbar region: Secondary | ICD-10-CM | POA: Insufficient documentation

## 2024-03-23 DIAGNOSIS — M4186 Other forms of scoliosis, lumbar region: Secondary | ICD-10-CM | POA: Insufficient documentation

## 2024-03-23 DIAGNOSIS — M5459 Other low back pain: Secondary | ICD-10-CM | POA: Diagnosis present

## 2024-03-23 DIAGNOSIS — Z789 Other specified health status: Secondary | ICD-10-CM | POA: Insufficient documentation

## 2024-03-23 DIAGNOSIS — M5442 Lumbago with sciatica, left side: Secondary | ICD-10-CM | POA: Diagnosis present

## 2024-03-23 DIAGNOSIS — M545 Low back pain, unspecified: Secondary | ICD-10-CM | POA: Insufficient documentation

## 2024-03-23 DIAGNOSIS — M47817 Spondylosis without myelopathy or radiculopathy, lumbosacral region: Secondary | ICD-10-CM | POA: Insufficient documentation

## 2024-03-23 DIAGNOSIS — M48061 Spinal stenosis, lumbar region without neurogenic claudication: Secondary | ICD-10-CM | POA: Insufficient documentation

## 2024-03-23 DIAGNOSIS — M79605 Pain in left leg: Secondary | ICD-10-CM

## 2024-03-23 DIAGNOSIS — R911 Solitary pulmonary nodule: Secondary | ICD-10-CM | POA: Insufficient documentation

## 2024-03-23 DIAGNOSIS — M4310 Spondylolisthesis, site unspecified: Secondary | ICD-10-CM | POA: Insufficient documentation

## 2024-03-23 DIAGNOSIS — M47819 Spondylosis without myelopathy or radiculopathy, site unspecified: Secondary | ICD-10-CM | POA: Insufficient documentation

## 2024-03-23 DIAGNOSIS — G894 Chronic pain syndrome: Secondary | ICD-10-CM | POA: Insufficient documentation

## 2024-03-23 DIAGNOSIS — M899 Disorder of bone, unspecified: Secondary | ICD-10-CM | POA: Diagnosis present

## 2024-03-23 DIAGNOSIS — G8929 Other chronic pain: Secondary | ICD-10-CM | POA: Diagnosis present

## 2024-03-23 DIAGNOSIS — Z79899 Other long term (current) drug therapy: Secondary | ICD-10-CM | POA: Diagnosis present

## 2024-03-23 MED ORDER — PREDNISONE 20 MG PO TABS: ORAL_TABLET | ORAL | 0 refills | Status: AC

## 2024-03-23 NOTE — Patient Instructions (Signed)

## 2024-03-25 ENCOUNTER — Telehealth: Payer: Self-pay | Admitting: Pain Medicine

## 2024-03-25 NOTE — Telephone Encounter (Signed)
 Patient was questioning abnormal lab work.  Glu was slightly high and patient was notified.

## 2024-03-25 NOTE — Telephone Encounter (Signed)
 Patient wants to get results for her blood work she had done on initial visit. She is scheduled for MRI on 04-01-24 and doesn't want to get appt until after that is done.

## 2024-03-26 LAB — COMPLIANCE DRUG ANALYSIS, UR

## 2024-03-29 LAB — 25-HYDROXY VITAMIN D LCMS D2+D3
25-Hydroxy, Vitamin D-2: <1 ng/mL
25-Hydroxy, Vitamin D-3: 29 ng/mL
25-Hydroxy, Vitamin D: 29 ng/mL — ABNORMAL LOW

## 2024-03-29 LAB — COMP. METABOLIC PANEL (12)
AST: 19 IU/L (ref 0–40)
Albumin: 4.6 g/dL (ref 3.8–4.9)
Alkaline Phosphatase: 98 IU/L (ref 44–121)
BUN/Creatinine Ratio: 22 (ref 9–23)
BUN: 17 mg/dL (ref 6–24)
Bilirubin Total: 0.5 mg/dL (ref 0.0–1.2)
Calcium: 9.9 mg/dL (ref 8.7–10.2)
Chloride: 100 mmol/L (ref 96–106)
Creatinine, Ser: 0.76 mg/dL (ref 0.57–1.00)
Globulin, Total: 2.5 g/dL (ref 1.5–4.5)
Glucose: 115 mg/dL — ABNORMAL HIGH (ref 70–99)
Potassium: 4.3 mmol/L (ref 3.5–5.2)
Sodium: 140 mmol/L (ref 134–144)
Total Protein: 7.1 g/dL (ref 6.0–8.5)
eGFR: 92 mL/min/{1.73_m2} (ref >59)

## 2024-03-29 LAB — VITAMIN B12: Vitamin B-12: 566 pg/mL (ref 232–1245)

## 2024-03-29 LAB — C-REACTIVE PROTEIN: CRP: 7 mg/L (ref 0–10)

## 2024-03-29 LAB — SEDIMENTATION RATE: Sed Rate: 30 mm/h (ref 0–40)

## 2024-03-29 LAB — MAGNESIUM: Magnesium: 1.9 mg/dL (ref 1.6–2.3)

## 2024-03-31 ENCOUNTER — Telehealth: Payer: Self-pay

## 2024-03-31 NOTE — Telephone Encounter (Signed)
 The request for lumbar MRI was denied because Dr. Mozell Arias had recently tried to request one and they denied it so they wouldn't let me proceed with the submission. Her xrays are completed and she has a recent lumbar CT. I will schedule her a second visit.

## 2024-04-01 ENCOUNTER — Ambulatory Visit
Admission: RE | Admit: 2024-04-01 | Discharge: 2024-04-01 | Disposition: A | Discharge status: Home / Self Care | Source: Ambulatory Visit | Attending: Family Medicine | Admitting: Family Medicine

## 2024-04-01 DIAGNOSIS — Z1231 Encounter for screening mammogram for malignant neoplasm of breast: Secondary | ICD-10-CM | POA: Diagnosis present

## 2024-04-15 ENCOUNTER — Ambulatory Visit: Admitting: Pain Medicine

## 2024-05-03 DIAGNOSIS — M545 Low back pain, unspecified: Secondary | ICD-10-CM | POA: Insufficient documentation

## 2024-05-03 DIAGNOSIS — M47816 Spondylosis without myelopathy or radiculopathy, lumbar region: Secondary | ICD-10-CM | POA: Insufficient documentation

## 2024-05-03 NOTE — Progress Notes (Unsigned)
 PROVIDER NOTE: Interpretation of information contained herein should be left to medically-trained personnel. Specific patient instructions are provided elsewhere under Patient Instructions section of medical record. This document was created in part using AI and STT-dictation technology, any transcriptional errors that may result from this process are unintentional.  Patient: Shannon Hughes  Service: E/M   PCP: Diedra Lame, MD  DOB: Nov 20, 1965  DOS: 05/04/2024  Provider: Eric DELENA Como, MD  MRN: [id number]  Delivery: Face-to-face  Specialty: Interventional Pain Management  Type: Established Patient  Setting: Ambulatory outpatient facility  Specialty designation: 09  Referring Prov.: Diedra Lame, MD  Location: Outpatient office facility       Primary Reason(s) for Visit: Encounter for evaluation before starting new chronic pain management plan of care (Level of risk: moderate) CC: No chief complaint on file.  HPI  Shannon Hughes is a 58 y.o. year old, female patient, who comes today for a follow-up evaluation to review the test results and decide on a treatment plan. She has Asthma, well controlled; Family history of breast cancer; Hyperlipidemia; Postmenopausal; Increased risk of breast cancer; Elevated blood sugar level; Pure hypercholesterolemia; Chronic pain syndrome; Pharmacologic therapy; Disorder of skeletal system; Problems influencing health status; Lumbar spondylosis; Lumbar nerve root impingement (L2-3, L4-5); Lumbar facet arthropathy w/ effusion (L4-5); Levoscoliosis of lumbar spine (7 degrees); Lumbar foraminal stenosis (Right: L2-3); Lumbar lateral recess stenosis (Right: L2-3) (Left: L4-5); Lumbosacral facet arthropathy (L5-S1); Grade 1 (3 mm) Retrolisthesis of L3/L4; Abnormal CT scan, lumbar spine (11/22/2023); Nodule of left lung; Low back pain of over 3 months duration; Multifactorial low back pain; Chronic low back pain (1ry area of Pain) (Bilateral) (R>L) w/ sciatica (Left);  Chronic lower extremity pain (2ry area of Pain) (intermittent) (Left); Low back pain potentially associated with radiculopathy; Intractable low back pain; Lumbar facet joint pain (Bilateral) (R>L); Chronic lumbar radiculopathy (Left) (L3); DDD (degenerative disc disease), lumbar w/ LBP & LEP; Chronic low back pain (Bilateral) w/o sciatica; and Spondylosis without myelopathy or radiculopathy, lumbar region on their problem list. Her primarily concern today is the No chief complaint on file.  Pain Assessment: Location:     Radiating:   Onset:   Duration:   Quality:   Severity:  /10 (subjective, self-reported pain score)  Effect on ADL:   Timing:   Modifying factors:   BP:    HR:    Shannon Hughes comes in today for a follow-up visit after her initial evaluation on 03/23/2024. Today we went over the results of her tests. These were explained in Layman's terms. During today's appointment we went over my diagnostic impression, as well as the proposed treatment plan.  ***: Shannon Hughes is a 58 year old female who presents with chronic right lower back pain and intermittent left leg pain. She was referred by her primary care physician for further evaluation and management of her chronic pain.   She has been experiencing chronic right lower back pain for the past two years, primarily located on the right side. The pain is persistent and has progressively worsened over time. She attributes the pain to activities such as taking care of her mother and lifting children. She has not undergone any surgery for this condition but received a bilateral L4-5 transforaminal injection in February, which did not provide relief. The pain is exacerbated by certain movements, such as hyperextension and lateral bending, particularly on the right side. No radicular symptoms are present in the right leg.   In addition to the back pain,  she experiences intermittent pain in her left leg, described as pulsating in the  anterior and posterior regions of the thigh, but it does not extend below the knee. The leg pain does not occur simultaneously with the back pain and is not present at the time of the visit. No trauma or surgery to the left leg.   Her past medical history includes a CT scan that revealed a 3 mm spondylolisthesis of L3 over L4 and levoscoliosis. An MRI was ordered but denied by insurance. She has not had any radicular symptoms that would typically warrant an MRI approval.  No current leg pain, numbness, or weakness in the right leg. The left leg pain does not extend below the knee and is not present at the time of the visit. She experiences back pain primarily on the right side, which worsens with certain movements.   The patient denies having had a steroid taper.  She does admit having non-insulin-dependent diabetes mellitus and taking metformin at bedtime.  She cannot tell me what her average blood sugar is since she seems to be very poorly monitoring her levels.  Today we talked about the importance of monitoring her blood sugar specially if she is going to be taking the prednisone  taper.  She was informed that the prednisone  will increase her blood sugar and to seek help if it gets completely out of control.  However the patient denies ever having had diabetic ketoacidosis or diabetic coma.   In addition the patient confirms having tried physical therapy for her low back pain twice but had to stop it due to the physical therapy making her pain worse which is typical of problems with lumbar spine instability and/or facet joint disease.  ***   Discussed the use of AI scribe software for clinical note transcription with the patient, who gave verbal consent to proceed.  History of Present Illness          Patient presented with interventional treatment options. Shannon Hughes was informed that I will not be providing medication management. Pharmacotherapy evaluation including recommendations may be  offered, if specifically requested.   Controlled Substance Pharmacotherapy Assessment REMS (Risk Evaluation and Mitigation Strategy)  Opioid Analgesic:  For veterinary use: Hydromet 5 mg - 1.5 mg per 5 mL solution (180 mL) (last filled on 01/11/2024). MME/day: 30 mg/day   Pill Count: None expected due to no prior prescriptions written by our practice. No notes on file  Pharmacokinetics: Liberation and absorption (onset of action): WNL Distribution (time to peak effect): WNL Metabolism and excretion (duration of action): WNL         Pharmacodynamics: Desired effects: Analgesia: Shannon Hughes reports >50% benefit. Functional ability: Patient reports that medication allows her to accomplish basic ADLs Clinically meaningful improvement in function (CMIF): Sustained CMIF goals met Perceived effectiveness: Described as relatively effective, allowing for increase in activities of daily living (ADL) Undesirable effects: Side-effects or Adverse reactions: None reported Monitoring: [place] PMP: PDMP not reviewed this encounter. Online review of the past 39-month period previously conducted. Not applicable at this point since we have not taken over the patient's medication management yet. List of other Serum/Urine Drug Screening Test(s):  No results found for: AMPHSCRSER, BARBSCRSER, BENZOSCRSER, COCAINSCRSER, COCAINSCRNUR, PCPSCRSER, THCSCRSER, THCU, CANNABQUANT, OPIATESCRSER, OXYSCRSER, PROPOXSCRSER, ETH, CBDTHCR, D8THCCBX, D9THCCBX List of all UDS test(s) done:  Lab Results  Component Value Date   SUMMARY FINAL 03/23/2024   Last UDS on record: Summary  Date Value Ref Range Status  03/23/2024 FINAL  Final  Comment:    ==================================================================== Compliance Drug Analysis, Ur ==================================================================== Test                             Result       Flag       Units  Drug Present  and Declared for Prescription Verification   Acetaminophen                  PRESENT      EXPECTED ==================================================================== Test                      Result    Flag   Units      Ref Range   Creatinine              67               mg/dL      >=79 ==================================================================== Declared Medications:  The flagging and interpretation on this report are based on the  following declared medications.  Unexpected results may arise from  inaccuracies in the declared medications.   **Note: The testing scope of this panel does not include small to  moderate amounts of these reported medications:   Acetaminophen (Tylenol)   **Note: The testing scope of this panel does not include the  following reported medications:   Albuterol (Ventolin HFA)  Meloxicam (Mobic)  Montelukast (Singulair)  Multivitamin  Vitamin D  ==================================================================== For clinical consultation, please call [phone]. ====================================================================    UDS interpretation: No unexpected findings.          Medication Assessment Form: Not applicable. No opioids. Treatment compliance: Not applicable Risk Assessment Profile: Aberrant behavior: See initial evaluations. None observed or detected today Comorbid factors increasing risk of overdose: See initial evaluation. No additional risks detected today Opioid risk tool (ORT):     03/23/2024    2:18 PM  Opioid Risk   Alcohol 3  Illegal Drugs 0  Rx Drugs 0  Alcohol 0  Illegal Drugs 0  Rx Drugs 0  Psychological Disease 0  ADD Negative  OCD Negative  Bipolar Negative  Depression 0  Opioid Risk Tool Scoring 3  Opioid Risk Interpretation Low Risk    ORT Scoring interpretation table:  Score <3 = Low Risk for SUD  Score between 4-7 = Moderate Risk for SUD  Score >8 = High Risk for Opioid Abuse   Risk  of substance use disorder (SUD): Low  Risk Mitigation Strategies:  Patient opioid safety counseling: No controlled substances prescribed. Patient-Prescriber Agreement (PPA): No agreement signed.  Controlled substance notification to other providers: None required. No opioid therapy.  Pharmacologic Plan: Non-opioid analgesic therapy offered. Interventional alternatives discussed.             Laboratory Chemistry Profile   Renal Lab Results  Component Value Date   BUN 17 03/23/2024   CREATININE 0.76 03/23/2024   BCR 22 03/23/2024     Electrolytes Lab Results  Component Value Date   NA 140 03/23/2024   K 4.3 03/23/2024   CL 100 03/23/2024   CALCIUM 9.9 03/23/2024   MG 1.9 03/23/2024     Hepatic Lab Results  Component Value Date   AST 19 03/23/2024   ALBUMIN 4.6 03/23/2024   ALKPHOS 98 03/23/2024     ID No results found for: LYMEIGGIGMAB, HIV, SARSCOV2NAA, STAPHAUREUS, MRSAPCR, HCVAB, PREGTESTUR, RMSFIGG, QFVRPH1IGG, QFVRPH2IGG   Bone Lab Results  Component Value Date  25OHVITD1 29 (L) 03/23/2024   25OHVITD2 <1.0 03/23/2024   25OHVITD3 29 03/23/2024     Endocrine Lab Results  Component Value Date   GLUCOSE 115 (H) 03/23/2024     Neuropathy Lab Results  Component Value Date   VITAMINB12 566 03/23/2024     CNS No results found for: COLORCSF, APPEARCSF, RBCCOUNTCSF, WBCCSF, POLYSCSF, LYMPHSCSF, EOSCSF, PROTEINCSF, GLUCCSF, JCVIRUS, CSFOLI, IGGCSF, LABACHR, ACETBL   Inflammation (CRP: Acute  ESR: Chronic) Lab Results  Component Value Date   CRP 7 03/23/2024   ESRSEDRATE 30 03/23/2024     Rheumatology No results found for: RF, ANA, LABURIC, URICUR, LYMEIGGIGMAB, LYMEABIGMQN, HLAB27   Coagulation No results found for: INR, LABPROT, APTT, PLT, DDIMER, LABHEMA, VITAMINK1, AT3   Cardiovascular No results found for: BNP, CKTOTAL, CKMB, TROPONINI, HGB, HCT, LABVMA,  EPIRU, EPINEPH24HUR, NOREPRU, NOREPI24HUR, DOPARU, DOPAM24HRUR   Screening No results found for: SARSCOV2NAA, COVIDSOURCE, STAPHAUREUS, MRSAPCR, HCVAB, HIV, PREGTESTUR   Cancer No results found for: CEA, CA125, LABCA2   Allergens No results found for: ALMOND, APPLE, ASPARAGUS, AVOCADO, BANANA, BARLEY, BASIL, BAYLEAF, GREENBEAN, LIMABEAN, WHITEBEAN, BEEFIGE, REDBEET, BLUEBERRY, BROCCOLI, CABBAGE, MELON, CARROT, CASEIN, CASHEWNUT, CAULIFLOWER, CELERY     Note: Lab results reviewed.  Recent Diagnostic Imaging Review  Lumbosacral Imaging: Lumbar CT wo contrast: Results for orders placed during the hospital encounter of 11/08/23 CT LUMBAR SPINE WO CONTRAST  Narrative CLINICAL DATA:  Right low back pain  EXAM: CT LUMBAR SPINE WITHOUT CONTRAST  TECHNIQUE: Multidetector CT imaging of the lumbar spine was performed without intravenous contrast administration. Multiplanar CT image reconstructions were also generated.  RADIATION DOSE REDUCTION: This exam was performed according to the departmental dose-optimization program which includes automated exposure control, adjustment of the mA and/or kV according to patient size and/or use of iterative reconstruction technique.  COMPARISON:  Report from radiographs dated 07/22/2019  FINDINGS: Segmentation: The lowest lumbar type non-rib-bearing vertebra is labeled as L5.  Alignment: 3 mm degenerative retrolisthesis L3-4. 7 degrees levoconvex lumbar scoliosis.  Vertebrae: Widened left facet joint at L4-5 suspicious for possible facet joint effusion.  Loss of intervertebral disc height at the L3-4 level with endplate sclerosis eccentric to the right, and right eccentric spurring. Small Schmorl's nodes eccentric to the right the L3-4 level. Vacuum disc phenomenon also observed at this level.  No fracture or acute bony findings.  Paraspinal and other soft  tissues: Suspected small benign peripelvic cysts of the left kidney, partially obscured by motion artifact. No further imaging workup of these lesions is indicated. Minimal atheromatous vascular calcification of the abdominal aorta.  Disc levels:  T12-L1: Unremarkable  L1-2: Unremarkable  L2-3: Borderline right foraminal stenosis and borderline right subarticular lateral recess stenosis due to disc bulge and intervertebral spurring.  L4-5: Borderline left subarticular lateral recess stenosis due to disc bulge.  L5-S1: No impingement.  Mild degenerative facet arthropathy.  IMPRESSION: 1. Lumbar spondylosis and degenerative disc disease, causing borderline impingement at L2-3 and L4-5. 2. Widened left facet joint at L4-5 suspicious for possible facet joint effusion. 3. 7 degrees levoconvex lumbar scoliosis. 4. Minimal atheromatous vascular calcification of the abdominal aorta. Aortic Atherosclerosis (ICD10-I70.0).   Electronically Signed By: Ryan Salvage M.D. On: 11/22/2023 09:37  Lumbar DG Bending views: Results for orders placed during the hospital encounter of 03/23/24 DG Lumbar Spine Complete W/Bend  Narrative CLINICAL DATA:  Low back pain.  EXAM: LUMBAR SPINE - COMPLETE WITH BENDING VIEWS  COMPARISON:  None Available.  FINDINGS: There is no evidence of lumbar spine fracture. Mild  levocurvature spine. Moderate narrow intervertebral space at L3-4 with endplate sclerosis at superior endplate of L4. Minimal anterior spurring noted at L3-4. Mild retrolisthesis of L3 on L4. Minimal grade 1 anterolisthesis of L4 and L5.  IMPRESSION: Degenerative joint changes of lumbar spine more prominently involving L3-4 as described.   Electronically Signed By: Craig Farr M.D. On: 03/23/2024 16:26  Complexity Note: Imaging results reviewed.                         Meds   Current Outpatient Medications:    acetaminophen (TYLENOL) 325 MG tablet, Take 650 mg by  mouth every 6 (six) hours as needed., Disp: , Rfl:    albuterol (PROVENTIL HFA;VENTOLIN HFA) 108 (90 Base) MCG/ACT inhaler, Inhale 2 puffs into the lungs as needed., Disp: , Rfl:    cholecalciferol (VITAMIN D ) 1000 units tablet, Take 1,000 Units by mouth daily., Disp: , Rfl:    meloxicam (MOBIC) 15 MG tablet, Take 15 mg by mouth daily., Disp: , Rfl:    montelukast (SINGULAIR) 10 MG tablet, Take by mouth., Disp: , Rfl:    OVER THE COUNTER MEDICATION, Take 1 tablet by mouth daily. multivitamin, Disp: , Rfl:   ROS  Constitutional: Denies any fever or chills Gastrointestinal: No reported hemesis, hematochezia, vomiting, or acute GI distress Musculoskeletal: Denies any acute onset joint swelling, redness, loss of ROM, or weakness Neurological: No reported episodes of acute onset apraxia, aphasia, dysarthria, agnosia, amnesia, paralysis, loss of coordination, or loss of consciousness  Allergies  Shannon Hughes is allergic to penicillins.  PFSH  Drug: Shannon Hughes  reports no history of drug use. Alcohol:  reports current alcohol use. Tobacco:  reports that she has never smoked. She has never used smokeless tobacco. Medical:  has a past medical history of Asthma, BRCA negative (08/2018), Endometriosis, Family history of breast cancer, Increased risk of breast cancer (08/2018), Left elbow fracture, and Nodule of left lung. Surgical: Shannon Hughes  has a past surgical history that includes Elbow surgery; Tonsillectomy and adenoidectomy; and Laparoscopic total hysterectomy. Family: family history includes Breast cancer (age of onset: 45) in her maternal aunt; Breast cancer (age of onset: 61) in her maternal grandmother; Breast cancer (age of onset: 49) in her cousin; Cancer (age of onset: 79) in her maternal uncle; Diabetes in her mother; Stroke in her mother.  Constitutional Exam  General appearance: Well nourished, well developed, and well hydrated. In no apparent acute distress There were no vitals filed  for this visit. BMI Assessment: Estimated body mass index is 35.63 kg/m as calculated from the following:   Height as of 03/23/24: 5' 0.5 (1.537 m).   Weight as of 03/23/24: 185 lb 8 oz (84.1 kg).  BMI interpretation table: BMI level Category Range association with higher incidence of chronic pain  <18 kg/m2 Underweight   18.5-24.9 kg/m2 Ideal body weight   25-29.9 kg/m2 Overweight Increased incidence by 20%  30-34.9 kg/m2 Obese (Class I) Increased incidence by 68%  35-39.9 kg/m2 Severe obesity (Class II) Increased incidence by 136%  >40 kg/m2 Extreme obesity (Class III) Increased incidence by 254%   Patient's current BMI Ideal Body weight  There is no height or weight on file to calculate BMI. Patient weight not recorded   BMI Readings from Last 4 Encounters:  03/23/24 35.63 kg/m  09/27/21 33.07 kg/m  11/24/20 33.82 kg/m  03/15/20 30.48 kg/m   Wt Readings from Last 4 Encounters:  03/23/24 185 lb 8 oz (84.1  kg)  09/27/21 175 lb (79.4 kg)  11/24/20 179 lb (81.2 kg)  03/15/20 186 lb (84.4 kg)    Psych/Mental status: Alert, oriented x 3 (person, place, & time)       Eyes: PERLA Respiratory: No evidence of acute respiratory distress  Assessment & Plan  Primary Diagnosis & Pertinent Problem List: The primary encounter diagnosis was Chronic low back pain (Bilateral) w/o sciatica. Diagnoses of Low back pain of over 3 months duration, Intractable low back pain, Multifactorial low back pain, Spondylosis without myelopathy or radiculopathy, lumbar region, Lumbar facet arthropathy w/ effusion (L4-5), Lumbar facet joint pain (Bilateral) (R>L), Lumbosacral facet arthropathy (L5-S1), Chronic lower extremity pain (2ry area of Pain) (intermittent) (Left), DDD (degenerative disc disease), lumbar w/ LBP & LEP, and Abnormal CT scan, lumbar spine (11/22/2023) were also pertinent to this visit. Visit Diagnosis: 1. Chronic low back pain (Bilateral) w/o sciatica   2. Low back pain of over 3  months duration   3. Intractable low back pain   4. Multifactorial low back pain   5. Spondylosis without myelopathy or radiculopathy, lumbar region   6. Lumbar facet arthropathy w/ effusion (L4-5)   7. Lumbar facet joint pain (Bilateral) (R>L)   8. Lumbosacral facet arthropathy (L5-S1)   9. Chronic lower extremity pain (2ry area of Pain) (intermittent) (Left)   10. DDD (degenerative disc disease), lumbar w/ LBP & LEP   11. Abnormal CT scan, lumbar spine (11/22/2023)    Problems updated and reviewed during this visit: Problem  Chronic low back pain (Bilateral) w/o sciatica  Spondylosis Without Myelopathy Or Radiculopathy, Lumbar Region  Abnormal CT scan, lumbar spine (11/22/2023)   (11/22/2023) LUMBAR CT FINDINGS: Alignment: 3 mm degenerative retrolisthesis L3-4. 7 degrees levoconvex lumbar scoliosis. Vertebrae: Widened left facet joint at L4-5 suspicious for possible facet joint effusion.  Loss of intervertebral disc height at the L3-4 level with endplate sclerosis eccentric to the right, and right eccentric spurring. Small Schmorl's nodes eccentric to the right the L3-4 level. Vacuum disc phenomenon also observed at this level.  Paraspinal and other soft tissues: Suspected small benign peripelvic cysts of the left kidney. Minimal atheromatous vascular calcification of the abdominal aorta.  DISC LEVELS: L2-3: Borderline right foraminal stenosis and borderline right subarticular lateral recess stenosis due to disc bulge and intervertebral spurring. L4-5: Borderline left subarticular lateral recess stenosis due to disc bulge. L5-S1: Mild degenerative facet arthropathy.  IMPRESSION: 1. Lumbar spondylosis and degenerative disc disease, causing borderline impingement at L2-3 and L4-5. 2. Widened left facet joint at L4-5 suspicious for possible facet joint effusion. 3. 7 degrees levoconvex lumbar scoliosis. 4. Minimal atheromatous vascular calcification of the abdominal aorta. Aortic  Atherosclerosis (ICD10-I70.0).     Plan of Care  Assessment and Plan            Pharmacotherapy (Medications Ordered): No orders of the defined types were placed in this encounter.  Procedure Orders    No procedure(s) ordered today   Lab Orders  No laboratory test(s) ordered today   Imaging Orders  No imaging studies ordered today   Referral Orders  No referral(s) requested today    Pharmacological management:  Opioid Analgesics: I will not be prescribing any opioids at this time Membrane stabilizer: I will not be prescribing any at this time Muscle relaxant: I will not be prescribing any at this time NSAID: I will not be prescribing any at this time Other analgesic(s): I will not be prescribing any at this time  Interventional Therapies  Risk Factors  Considerations  Medical Comorbidities:     Planned  Pending:   Lumbar MRI ordered to evaluate left L3 radiculopathy. X-rays of the lumbar spine on flexion and extension ordered to evaluate the patient's L3-4 spondylolisthesis associated with intermittent L3 radiculopathy and to determine whether or not her spine is unstable.   Under consideration:   Pending   Completed:   None at this time   Therapeutic  Palliative (PRN) options:   None established   Completed by other providers:   Diagnostic/therapeutic bilateral L4-5 TFESI x1 (12/27/2023) by Morene Falcon, DO ([facility])        Provider-requested follow-up: No follow-ups on file. Recent Visits Date Type Provider Dept  03/23/24 Office Visit Tanya Glisson, MD [facility]  Showing recent visits within past 90 days and meeting all other requirements Future Appointments Date Type Provider Dept  05/04/24 Appointment Tanya Glisson, MD [facility]  Showing future appointments within next 90 days and meeting all other requirements   Primary Care Physician: Diedra Lame, MD  Duration of encounter: *** minutes.   Total time on encounter, as per AMA guidelines included both the face-to-face and non-face-to-face time personally spent by the physician and/or other qualified health care professional(s) on the day of the encounter (includes time in activities that require the physician or other qualified health care professional and does not include time in activities normally performed by clinical staff). Physician's time may include the following activities when performed: Preparing to see the patient (e.g., pre-charting review of records, searching for previously ordered imaging, lab work, and nerve conduction tests) Review of prior analgesic pharmacotherapies. Reviewing PMP Interpreting ordered tests (e.g., lab work, imaging, nerve conduction tests) Performing post-procedure evaluations, including interpretation of diagnostic procedures Obtaining and/or reviewing separately obtained history Performing a medically appropriate examination and/or evaluation Counseling and educating the patient/family/caregiver Ordering medications, tests, or procedures Referring and communicating with other health care professionals (when not separately reported) Documenting clinical information in the electronic or other health record Independently interpreting results (not separately reported) and communicating results to the patient/ family/caregiver Care coordination (not separately reported)  Note by: Glisson DELENA Tanya, MD (TTS technology used. I apologize for any typographical errors that were not detected and corrected.) Date: 05/04/2024; Time: 1:18 PM

## 2024-05-04 ENCOUNTER — Encounter: Payer: Self-pay | Admitting: Pain Medicine

## 2024-05-04 ENCOUNTER — Ambulatory Visit: Attending: Pain Medicine | Admitting: Pain Medicine

## 2024-05-04 VITALS — BP 123/69 | HR 74 | Temp 98.2°F | Ht 60.0 in | Wt 182.0 lb

## 2024-05-04 DIAGNOSIS — R937 Abnormal findings on diagnostic imaging of other parts of musculoskeletal system: Secondary | ICD-10-CM | POA: Diagnosis present

## 2024-05-04 DIAGNOSIS — M51362 Other intervertebral disc degeneration, lumbar region with discogenic back pain and lower extremity pain: Secondary | ICD-10-CM | POA: Insufficient documentation

## 2024-05-04 DIAGNOSIS — M47817 Spondylosis without myelopathy or radiculopathy, lumbosacral region: Secondary | ICD-10-CM | POA: Diagnosis present

## 2024-05-04 DIAGNOSIS — M5459 Other low back pain: Secondary | ICD-10-CM | POA: Insufficient documentation

## 2024-05-04 DIAGNOSIS — G8929 Other chronic pain: Secondary | ICD-10-CM | POA: Insufficient documentation

## 2024-05-04 DIAGNOSIS — M79605 Pain in left leg: Secondary | ICD-10-CM | POA: Diagnosis present

## 2024-05-04 DIAGNOSIS — M4186 Other forms of scoliosis, lumbar region: Secondary | ICD-10-CM | POA: Diagnosis present

## 2024-05-04 DIAGNOSIS — M545 Low back pain, unspecified: Secondary | ICD-10-CM | POA: Insufficient documentation

## 2024-05-04 DIAGNOSIS — M2540 Effusion, unspecified joint: Secondary | ICD-10-CM | POA: Insufficient documentation

## 2024-05-04 DIAGNOSIS — M47819 Spondylosis without myelopathy or radiculopathy, site unspecified: Secondary | ICD-10-CM | POA: Insufficient documentation

## 2024-05-04 DIAGNOSIS — M47816 Spondylosis without myelopathy or radiculopathy, lumbar region: Secondary | ICD-10-CM | POA: Diagnosis present

## 2024-05-04 DIAGNOSIS — M4316 Spondylolisthesis, lumbar region: Secondary | ICD-10-CM | POA: Diagnosis present

## 2024-05-04 DIAGNOSIS — M4310 Spondylolisthesis, site unspecified: Secondary | ICD-10-CM | POA: Insufficient documentation

## 2024-05-04 NOTE — Patient Instructions (Signed)
 ______________________________________________________________________    Procedure instructions  Stop blood-thinners  Do not eat or drink fluids (other than water) for 6 hours before your procedure  No water for 2 hours before your procedure  Take your blood pressure medicine with a sip of water  Arrive 30 minutes before your appointment  If sedation is planned, bring suitable driver. [place], [place], & public transportation are NOT APPROVED)  Carefully read the Preparing for your procedure detailed instructions  If you have questions call us  at [phone]  Procedure appointments are for procedures only.   NO medication refills or new problem evaluations will be done on procedure days.   Only the scheduled, pre-approved procedure and side will be done.   ______________________________________________________________________      ______________________________________________________________________    Preparing for your procedure  Appointments: If you think you may not be able to keep your appointment, call 24-48 hours in advance to cancel. We need time to make it available to others.  Procedure visits are for procedures only. During your procedure appointment there will be: NO Prescription Refills*. NO medication changes or discussions*. NO discussion of disability issues*. NO unrelated pain problem evaluations*. NO evaluations to order other pain procedures*. *These will be addressed at a separate and distinct evaluation encounter on the provider's evaluation schedule and not during procedure days.  Instructions: Food intake: Avoid eating anything solid for at least 8 hours prior to your procedure. Clear liquid intake: You may take clear liquids such as water up to 2 hours prior to your procedure. (No carbonated drinks. No soda.) Transportation: Unless otherwise stated by your physician, bring a driver. (Driver cannot be a Market researcher, Pharmacist, community, or any other form of public  transportation.) Morning Medicines: Except for blood thinners, take all of your other morning medications with a sip of water. Make sure to take your heart and blood pressure medicines. If your blood pressure's lower number is above 100, the case will be rescheduled. Blood thinners: Make sure to stop your blood thinners as instructed.  If you take a blood thinner, but were not instructed to stop it, call our office [phone] and ask to talk to a nurse. Not stopping a blood thinner prior to certain procedures could lead to serious complications. Diabetics on insulin: Notify the staff so that you can be scheduled 1st case in the morning. If your diabetes requires high dose insulin, take only  of your normal insulin dose the morning of the procedure and notify the staff that you have done so. Preventing infections: Shower with an antibacterial soap the morning of your procedure.  Build-up your immune system: Take 1000 mg of Vitamin C with every meal (3 times a day) the day prior to your procedure. Antibiotics: Inform the nursing staff if you are taking any antibiotics or if you have any conditions that may require antibiotics prior to procedures. (Example: recent joint implants)   Pregnancy: If you are pregnant make sure to notify the nursing staff. Not doing so may result in injury to the fetus, including death.  Sickness: If you have a cold, fever, or any active infections, call and cancel or reschedule your procedure. Receiving steroids while having an infection may result in complications. Arrival: You must be in the facility at least 30 minutes prior to your scheduled procedure. Tardiness: Your scheduled time is also the cutoff time. If you do not arrive at least 15 minutes prior to your procedure, you will be rescheduled.  Children: Do not bring any children  with you. Make arrangements to keep them home. Dress appropriately: There is always a possibility that your clothing may get soiled. Avoid  long dresses. Valuables: Do not bring any jewelry or valuables.  Reasons to call and reschedule or cancel your procedure: (Following these recommendations will minimize the risk of a serious complication.) Surgeries: Avoid having procedures within 2 weeks of any surgery. (Avoid for 2 weeks before or after any surgery). Flu Shots: Avoid having procedures within 2 weeks of a flu shots or . (Avoid for 2 weeks before or after immunizations). Barium: Avoid having a procedure within 7-10 days after having had a radiological study involving the use of radiological contrast. (Myelograms, Barium swallow or enema study). Heart attacks: Avoid any elective procedures or surgeries for the initial 6 months after a Myocardial Infarction (Heart Attack). Blood thinners: It is imperative that you stop these medications before procedures. Let us  know if you if you take any blood thinner.  Infection: Avoid procedures during or within two weeks of an infection (including chest colds or gastrointestinal problems). Symptoms associated with infections include: Localized redness, fever, chills, night sweats or profuse sweating, burning sensation when voiding, cough, congestion, stuffiness, runny nose, sore throat, diarrhea, nausea, vomiting, cold or Flu symptoms, recent or current infections. It is specially important if the infection is over the area that we intend to treat. Heart and lung problems: Symptoms that may suggest an active cardiopulmonary problem include: cough, chest pain, breathing difficulties or shortness of breath, dizziness, ankle swelling, uncontrolled high or unusually low blood pressure, and/or palpitations. If you are experiencing any of these symptoms, cancel your procedure and contact your primary care physician for an evaluation.  Remember:  Regular Business hours are:  Monday to Thursday 8:00 AM to 4:00 PM  Provider's Schedule: Eric Como, MD:  Procedure days: Tuesday and Thursday 7:30  AM to 4:00 PM  Wallie Sherry, MD:  Procedure days: Monday and Wednesday 7:30 AM to 4:00 PM Last  Updated: 10/22/2023 ______________________________________________________________________      ______________________________________________________________________    General Risks and Possible Complications  Patient Responsibilities: It is important that you read this as it is part of your informed consent. It is our duty to inform you of the risks and possible complications associated with treatments offered to you. It is your responsibility as a patient to read this and to ask questions about anything that is not clear or that you believe was not covered in this document.  Patient's Rights: You have the right to refuse treatment. You also have the right to change your mind, even after initially having agreed to have the treatment done. However, under this last option, if you wait until the last second to change your mind, you may be charged for the materials used up to that point.  Introduction: Medicine is not an Visual merchandiser. Everything in Medicine, including the lack of treatment(s), carries the potential for danger, harm, or loss (which is by definition: Risk). In Medicine, a complication is a secondary problem, condition, or disease that can aggravate an already existing one. All treatments carry the risk of possible complications. The fact that a side effects or complications occurs, does not imply that the treatment was conducted incorrectly. It must be clearly understood that these can happen even when everything is done following the highest safety standards.  No treatment: You can choose not to proceed with the proposed treatment alternative. The "PRO(s)" would include: avoiding the risk of complications associated with the therapy. The "CON(s)"  would include: not getting any of the treatment benefits. These benefits fall under one of three categories: diagnostic; therapeutic; and/or  palliative. Diagnostic benefits include: getting information which can ultimately lead to improvement of the disease or symptom(s). Therapeutic benefits are those associated with the successful treatment of the disease. Finally, palliative benefits are those related to the decrease of the primary symptoms, without necessarily curing the condition (example: decreasing the pain from a flare-up of a chronic condition, such as incurable terminal cancer).  General Risks and Complications: These are associated to most interventional treatments. They can occur alone, or in combination. They fall under one of the following six (6) categories: no benefit or worsening of symptoms; bleeding; infection; nerve damage; allergic reactions; and/or death. No benefits or worsening of symptoms: In Medicine there are no guarantees, only probabilities. No healthcare provider can ever guarantee that a medical treatment will work, they can only state the probability that it may. Furthermore, there is always the possibility that the condition may worsen, either directly, or indirectly, as a consequence of the treatment. Bleeding: This is more common if the patient is taking a blood thinner, either prescription or over the counter (example: Goody Powders, Fish oil, Aspirin, Garlic, etc.), or if suffering a condition associated with impaired coagulation (example: Hemophilia, cirrhosis of the liver, low platelet counts, etc.). However, even if you do not have one on these, it can still happen. If you have any of these conditions, or take one of these drugs, make sure to notify your treating physician. Infection: This is more common in patients with a compromised immune system, either due to disease (example: diabetes, cancer, human immunodeficiency virus [HIV], etc.), or due to medications or treatments (example: therapies used to treat cancer and rheumatological diseases). However, even if you do not have one on these, it can still  happen. If you have any of these conditions, or take one of these drugs, make sure to notify your treating physician. Nerve Damage: This is more common when the treatment is an invasive one, but it can also happen with the use of medications, such as those used in the treatment of cancer. The damage can occur to small secondary nerves, or to large primary ones, such as those in the spinal cord and brain. This damage may be temporary or permanent and it may lead to impairments that can range from temporary numbness to permanent paralysis and/or brain death. Allergic Reactions: Any time a substance or material comes in contact with our body, there is the possibility of an allergic reaction. These can range from a mild skin rash (contact dermatitis) to a severe systemic reaction (anaphylactic reaction), which can result in death. Death: In general, any medical intervention can result in death, most of the time due to an unforeseen complication. ______________________________________________________________________      ____________________________________________________________________________________________  Spondylolisthesis  Spondylolisthesis is a condition that occurs when a vertebra in the spine slips out of place, usually in the lower back. Symptoms can vary from mild to severe, and a person may have no symptoms.  Some common symptoms include:  Back pain, especially chronic pain  Pain that radiates down the legs  Pain that worsens with exercise  Tightness in the hamstrings  Neck stiffness  Loss of spine flexibility  Weakness in the legs or trouble walking  Numbness and tingling in the groin and/or buttocks   Some causes of spondylolisthesis include: Birth defects. Sudden injury. Abnormal wear on the cartilage and bones, such as arthritis. Bone  disease and fractures. Certain sports activities, such as gymnastics, weightlifting, and football.  A doctor can diagnose spondylolisthesis  with a physical exam, X-rays, and possibly a CT scan.    Forward slippage is known as "Anterolisthesis".  Backward slippage is known as "Retrolisthesis".   Pathophysiology of Spondylolisthesis:   Grading Classification of Spondylolisthesis Grade I spondylolisthesis is 1 to 25% slippage, grade II is up to 50% slippage, grade III is up to 75% slippage, and grade IV is 76-100% slippage. If there is more than 100% slippage, it is known as spondyloptosis or grade V spondylolisthesis.       ____________________________________________________________________________________________

## 2024-05-04 NOTE — Progress Notes (Signed)
 Safety precautions to be maintained throughout the outpatient stay will include: orient to surroundings, keep bed in low position, maintain call bell within reach at all times, provide assistance with transfer out of bed and ambulation.

## 2024-05-21 ENCOUNTER — Ambulatory Visit: Admitting: Pain Medicine

## 2024-06-15 ENCOUNTER — Telehealth: Payer: Self-pay

## 2024-06-15 NOTE — Telephone Encounter (Signed)
 Patient called back after I called the first time. I talked with Dr Naveira and he stated that he would not be giving her a valium before her Iv stick. Called patient back and no answer. Left message on am that he will not be giving her a valium before her IV stick and that we would give her medication before he starts the procedure.

## 2024-06-15 NOTE — Telephone Encounter (Signed)
 Left message on answering machine and insteructed patient to take medication on the way to office with a little sip of water.Instructed to call back if needed.

## 2024-06-15 NOTE — Telephone Encounter (Signed)
 She is having a procedure tomorrow and she said Dr. Patterson told her he would give her a valium. She said once she takes it we will have to give her a while for it to work, She is phobic with needles and you cannot stick her until the medicine kicks in. Do you want to call it out and have her take it before she gets here?

## 2024-06-16 ENCOUNTER — Ambulatory Visit
Admission: RE | Admit: 2024-06-16 | Discharge: 2024-06-16 | Disposition: A | Discharge status: Home / Self Care | Source: Ambulatory Visit | Attending: Pain Medicine | Admitting: Pain Medicine

## 2024-06-16 ENCOUNTER — Ambulatory Visit (HOSPITAL_BASED_OUTPATIENT_CLINIC_OR_DEPARTMENT_OTHER): Admitting: Pain Medicine

## 2024-06-16 ENCOUNTER — Encounter: Payer: Self-pay | Admitting: Pain Medicine

## 2024-06-16 VITALS — BP 127/83 | HR 66 | Temp 97.8°F | Resp 18 | Ht 60.0 in | Wt 182.0 lb

## 2024-06-16 DIAGNOSIS — M2540 Effusion, unspecified joint: Secondary | ICD-10-CM | POA: Insufficient documentation

## 2024-06-16 DIAGNOSIS — F419 Anxiety disorder, unspecified: Secondary | ICD-10-CM | POA: Insufficient documentation

## 2024-06-16 DIAGNOSIS — M5459 Other low back pain: Secondary | ICD-10-CM | POA: Insufficient documentation

## 2024-06-16 DIAGNOSIS — M47817 Spondylosis without myelopathy or radiculopathy, lumbosacral region: Secondary | ICD-10-CM

## 2024-06-16 DIAGNOSIS — M4310 Spondylolisthesis, site unspecified: Secondary | ICD-10-CM | POA: Insufficient documentation

## 2024-06-16 DIAGNOSIS — M47816 Spondylosis without myelopathy or radiculopathy, lumbar region: Secondary | ICD-10-CM | POA: Diagnosis present

## 2024-06-16 DIAGNOSIS — M545 Low back pain, unspecified: Secondary | ICD-10-CM

## 2024-06-16 DIAGNOSIS — G8929 Other chronic pain: Secondary | ICD-10-CM | POA: Insufficient documentation

## 2024-06-16 DIAGNOSIS — M4316 Spondylolisthesis, lumbar region: Secondary | ICD-10-CM | POA: Diagnosis present

## 2024-06-16 DIAGNOSIS — M47819 Spondylosis without myelopathy or radiculopathy, site unspecified: Secondary | ICD-10-CM

## 2024-06-16 DIAGNOSIS — M4186 Other forms of scoliosis, lumbar region: Secondary | ICD-10-CM | POA: Diagnosis present

## 2024-06-16 MED ORDER — FENTANYL CITRATE (PF) 100 MCG/2ML IJ SOLN
25.0000 ug | INTRAMUSCULAR | Status: DC | PRN
  Administered 2024-06-16: 100 ug via INTRAVENOUS

## 2024-06-16 MED ORDER — ROPIVACAINE HCL 2 MG/ML IJ SOLN
INTRAMUSCULAR | Status: AC
  Filled 2024-06-16: qty 20

## 2024-06-16 MED ORDER — MIDAZOLAM HCL 5 MG/5ML IJ SOLN
INTRAMUSCULAR | Status: AC
  Filled 2024-06-16: qty 5

## 2024-06-16 MED ORDER — ROPIVACAINE HCL 2 MG/ML IJ SOLN
18.0000 mL | Freq: Once | INTRAMUSCULAR | Status: AC
  Administered 2024-06-16: 18 mL via PERINEURAL

## 2024-06-16 MED ORDER — MIDAZOLAM HCL 5 MG/5ML IJ SOLN
0.5000 mg | Freq: Once | INTRAMUSCULAR | Status: AC
  Administered 2024-06-16: 5 mg via INTRAVENOUS

## 2024-06-16 MED ORDER — TRIAMCINOLONE ACETONIDE 40 MG/ML IJ SUSP
INTRAMUSCULAR | Status: AC
  Filled 2024-06-16: qty 2

## 2024-06-16 MED ORDER — LIDOCAINE HCL 2 % IJ SOLN
INTRAMUSCULAR | Status: AC
  Filled 2024-06-16: qty 20

## 2024-06-16 MED ORDER — PENTAFLUOROPROP-TETRAFLUOROETH EX AERO: INHALATION_SPRAY | Freq: Once | CUTANEOUS | Status: AC

## 2024-06-16 MED ORDER — TRIAMCINOLONE ACETONIDE 40 MG/ML IJ SUSP
80.0000 mg | Freq: Once | INTRAMUSCULAR | Status: AC
  Administered 2024-06-16: 80 mg

## 2024-06-16 MED ORDER — LIDOCAINE HCL 2 % IJ SOLN
20.0000 mL | Freq: Once | INTRAMUSCULAR | Status: AC
  Administered 2024-06-16: 400 mg

## 2024-06-16 MED ORDER — FENTANYL CITRATE (PF) 100 MCG/2ML IJ SOLN
INTRAMUSCULAR | Status: AC
Start: 2024-06-16 — End: 2024-06-16
  Filled 2024-06-16: qty 2

## 2024-06-16 NOTE — Patient Instructions (Signed)

## 2024-06-16 NOTE — Progress Notes (Signed)
 Safety precautions to be maintained throughout the outpatient stay will include: orient to surroundings, keep bed in low position, maintain call bell within reach at all times, provide assistance with transfer out of bed and ambulation.

## 2024-06-16 NOTE — Progress Notes (Signed)
 PROVIDER NOTE: Interpretation of information contained herein should be left to medically-trained personnel. Specific patient instructions are provided elsewhere under Patient Instructions section of medical record. This document was created in part using STT-dictation technology, any transcriptional errors that may result from this process are unintentional.  Patient: Shannon Hughes Type: Established DOB: 1966-05-03 MRN: [id number] PCP: Diedra Lame, MD  Service: Procedure DOS: 06/16/2024 Setting: Ambulatory Location: Ambulatory outpatient facility Delivery: Face-to-face Provider: Eric DELENA Como, MD Specialty: Interventional Pain Management Specialty designation: 09 Location: Outpatient facility Ref. Prov.: Como Eric, MD       Interventional Therapy   Type: Lumbar Facet, Medial Branch Block(s) (w/ fluoroscopic mapping) #1  Laterality: Bilateral  Level: L2, L3, L4, L5, and S1 Medial Branch/Dorsal Rami Level(s). Injecting these levels blocks the L3-4, L4-5, and L5-S1 lumbar facet joints. Imaging: Fluoroscopic guidance Spinal ([id number]) Anesthesia: Local anesthesia (1-2% Lidocaine ) Anxiolysis: IV Versed  3.0 mg Sedation: Minimal Sedation Fentanyl  1 mL (50 mcg) DOS: 06/16/2024 Performed by: Eric DELENA Como, MD  Primary Purpose: Diagnostic/Therapeutic Indications: Low back pain severe enough to impact quality of life or function. 1. Chronic low back pain (Bilateral) w/o sciatica   2. Low back pain of over 3 months duration   3. Grade 1 (3 mm) Retrolisthesis of L3/L4   4. Grade 1 Anterolisthesis of L4/L5   5. Lumbar facet arthropathy w/ effusion (L4-5)   6. Lumbar facet joint pain (Bilateral) (R>L)   7. Levoscoliosis of lumbar spine (7 degrees)   8. Lumbosacral facet arthropathy (L5-S1)   9. Spondylosis without myelopathy or radiculopathy, lumbar region   10. Multifactorial low back pain    NAS-11 Pain score:   Pre-procedure: 8 /10   Post-procedure: 0-No  pain/10   Note: Very anxious w/ procedure. Occasional PVC's controlled w/ deep breaths.     Position / Prep / Materials:  Position: Prone  Prep solution: ChloraPrep (2% chlorhexidine gluconate and 70% isopropyl alcohol) Area Prepped: Posterolateral Lumbosacral Spine (Wide prep: From the lower border of the scapula down to the end of the tailbone and from flank to flank.)  Materials:  Tray: Block Needle(s):  Type: Spinal  Gauge (G): 22  Length: 5-in Qty: 4     H&P (Pre-op Assessment):  Shannon Hughes is a 58 y.o. (year old), female patient, seen today for interventional treatment. She  has a past surgical history that includes Elbow surgery; Tonsillectomy and adenoidectomy; and Laparoscopic total hysterectomy. Shannon Hughes has a current medication list which includes the following prescription(s): acetaminophen, albuterol, cholecalciferol, cyanocobalamin, meloxicam, metformin, montelukast, and OVER THE COUNTER MEDICATION, and the following Facility-Administered Medications: fentanyl . Her primarily concern today is the Back Pain  Initial Vital Signs:  Pulse/HCG Rate: 66ECG Heart Rate: 86 Temp: (!) 97.2 F (36.2 C) Resp: 18 BP: (!) 125/93 SpO2: 100 %  BMI: Estimated body mass index is 35.54 kg/m as calculated from the following:   Height as of this encounter: 5' (1.524 m).   Weight as of this encounter: 182 lb (82.6 kg).  Risk Assessment: Allergies: Reviewed. She is allergic to penicillins.  Allergy Precautions: None required Coagulopathies: Reviewed. None identified.  Blood-thinner therapy: None at this time Active Infection(s): Reviewed. None identified. Ms. January is afebrile  Site Confirmation: Shannon Hughes was asked to confirm the procedure and laterality before marking the site Procedure checklist: Completed Consent: Before the procedure and under the influence of no sedative(s), amnesic(s), or anxiolytics, the patient was informed of the treatment options, risks and possible  complications. To fulfill our ethical and  legal obligations, as recommended by the American Medical Association's Code of Ethics, I have informed the patient of my clinical impression; the nature and purpose of the treatment or procedure; the risks, benefits, and possible complications of the intervention; the alternatives, including doing nothing; the risk(s) and benefit(s) of the alternative treatment(s) or procedure(s); and the risk(s) and benefit(s) of doing nothing. The patient was provided information about the general risks and possible complications associated with the procedure. These may include, but are not limited to: failure to achieve desired goals, infection, bleeding, organ or nerve damage, allergic reactions, paralysis, and death. In addition, the patient was informed of those risks and complications associated to Spine-related procedures, such as failure to decrease pain; infection (i.e.: Meningitis, epidural or intraspinal abscess); bleeding (i.e.: epidural hematoma, subarachnoid hemorrhage, or any other type of intraspinal or peri-dural bleeding); organ or nerve damage (i.e.: Any type of peripheral nerve, nerve root, or spinal cord injury) with subsequent damage to sensory, motor, and/or autonomic systems, resulting in permanent pain, numbness, and/or weakness of one or several areas of the body; allergic reactions; (i.e.: anaphylactic reaction); and/or death. Furthermore, the patient was informed of those risks and complications associated with the medications. These include, but are not limited to: allergic reactions (i.e.: anaphylactic or anaphylactoid reaction(s)); adrenal axis suppression; blood sugar elevation that in diabetics may result in ketoacidosis or comma; water retention that in patients with history of congestive heart failure may result in shortness of breath, pulmonary edema, and decompensation with resultant heart failure; weight gain; swelling or edema; medication-induced  neural toxicity; particulate matter embolism and blood vessel occlusion with resultant organ, and/or nervous system infarction; and/or aseptic necrosis of one or more joints. Finally, the patient was informed that Medicine is not an exact science; therefore, there is also the possibility of unforeseen or unpredictable risks and/or possible complications that may result in a catastrophic outcome. The patient indicated having understood very clearly. We have given the patient no guarantees and we have made no promises. Enough time was given to the patient to ask questions, all of which were answered to the patient's satisfaction. Ms. Strahan has indicated that she wanted to continue with the procedure. Attestation: I, the ordering provider, attest that I have discussed with the patient the benefits, risks, side-effects, alternatives, likelihood of achieving goals, and potential problems during recovery for the procedure that I have provided informed consent. Date  Time: 06/16/2024  9:09 AM  Pre-Procedure Preparation:  Monitoring: As per clinic protocol. Respiration, ETCO2, SpO2, BP, heart rate and rhythm monitor placed and checked for adequate function Safety Precautions: Patient was assessed for positional comfort and pressure points before starting the procedure. Time-out: I initiated and conducted the Time-out before starting the procedure, as per protocol. The patient was asked to participate by confirming the accuracy of the Time Out information. Verification of the correct person, site, and procedure were performed and confirmed by me, the nursing staff, and the patient. Time-out conducted as per Joint Commission's Universal Protocol (UP.01.01.01). Time: 1001 Start Time: 1001 hrs.  Description of Procedure:          Laterality: (see above) Targeted Levels: (see above)  Safety Precautions: Aspiration looking for blood return was conducted prior to all injections. At no point did we inject any  substances, as a needle was being advanced. Before injecting, the patient was told to immediately notify me if she was experiencing any new onset of ringing in the ears, or metallic taste in the mouth. No attempts  were made at seeking any paresthesias. Safe injection practices and needle disposal techniques used. Medications properly checked for expiration dates. SDV (single dose vial) medications used. After the completion of the procedure, all disposable equipment used was discarded in the proper designated medical waste containers. Local Anesthesia: Protocol guidelines were followed. The patient was positioned over the fluoroscopy table. The area was prepped in the usual manner. The time-out was completed. The target area was identified using fluoroscopy. A 12-in long, straight, sterile hemostat was used with fluoroscopic guidance to locate the targets for each level blocked. Once located, the skin was marked with an approved surgical skin marker. Once all sites were marked, the skin (epidermis, dermis, and hypodermis), as well as deeper tissues (fat, connective tissue and muscle) were infiltrated with a small amount of a short-acting local anesthetic, loaded on a 10cc syringe with a 25G, 1.5-in  Needle. An appropriate amount of time was allowed for local anesthetics to take effect before proceeding to the next step. Local Anesthetic: Lidocaine  2.0% The unused portion of the local anesthetic was discarded in the proper designated containers. Technical description of process:  Medial Branch  Dorsal Rami Nerve Block (MBB):  Neuroanatomy note: Each lumbar facet joint receives dual innervation from medial branches arising from the posterior primary rami at the same level and one level above. The target for each lumbar medial branch is the junction of the ipsilateral superior articular and transverse process of the lower vertebral body. (i.e.: The L4-L5 facet joint is innervated by the L4 medial branch  [located at L5] and the L3 medial branch [located at L4]. Blocking the L4 Medial Branch is therefore achieved by injecting at the junction of the ipsilateral superior articular and transverse process of the lower vertebral body [L5].).  Exception: The exception to the above rule is the L5-S1 facet joint which has triple innervation requiring the L4 medial branch, as well as the L5 and the S1 Dorsal Rami(s) to be blocked to fully denervate the joint.  Under fluoroscopic guidance, a needle was inserted until contact was made with os over the target area. After negative aspiration, 0.5 mL of the nerve block solution was injected without difficulty or complication. Paresthesia were avoided during injection. The needle(s) were removed intact and without complication.  Once the entire procedure was completed, the treated area was cleaned, making sure to leave some of the prepping solution back to take advantage of its long term bactericidal properties.         Illustration of the posterior view of the lumbar spine and the posterior neural structures. Laminae of L2 through S1 are labeled. DPRL5, dorsal primary ramus of L5; DPRS1, dorsal primary ramus of S1; DPR3, dorsal primary ramus of L3; FJ, facet (zygapophyseal) joint L3-L4; I, inferior articular process of L4; LB1, lateral branch of dorsal primary ramus of L1; IAB, inferior articular branches from L3 medial branch (supplies L4-L5 facet joint); IBP, intermediate branch plexus; MB3, medial branch of dorsal primary ramus of L3; NR3, third lumbar nerve root; S, superior articular process of L5; SAB, superior articular branches from L4 (supplies L4-5 facet joint also); TP3, transverse process of L3.   Facet Joint Innervation (* possible contribution)  L1-2 T12, L1 (L2*)  Medial Branch  L2-3 L1, L2 (L3*)                     L3-4 L2, L3 (L4*)  L4-5 L3, L4 (L5*)                     L5-S1 L4, L5, S1                         Vitals:   06/16/24 1020 06/16/24 1025 06/16/24 1035 06/16/24 1044  BP: (!) 151/88 131/78 123/62 127/83  Pulse:      Resp: 16 15 19 18   Temp:  97.9 F (36.6 C)  97.8 F (36.6 C)  SpO2: 100% 98% 97% 98%  Weight:      Height:         End Time: 1020 hrs.  Imaging Guidance (Spinal):         Type of Imaging Technique: Fluoroscopy Guidance (Spinal) Indication(s): Fluoroscopy guidance for needle placement to enhance accuracy in procedures requiring precise needle localization for targeted delivery of medication in or near specific anatomical locations not easily accessible without such real-time imaging assistance. Exposure Time: Please see nurses notes. Contrast: None used. Fluoroscopic Guidance: I was personally present during the use of fluoroscopy. Tunnel Vision Technique used to obtain the best possible view of the target area. Parallax error corrected before commencing the procedure. Direction-depth-direction technique used to introduce the needle under continuous pulsed fluoroscopy. Once target was reached, antero-posterior, oblique, and lateral fluoroscopic projection used confirm needle placement in all planes. Images permanently stored in EMR. Interpretation: No contrast injected. I personally interpreted the imaging intraoperatively. Adequate needle placement confirmed in multiple planes. Permanent images saved into the patient's record.  Post-operative Assessment:  Post-procedure Vital Signs:  Pulse/HCG Rate: 6674 Temp: 97.8 F (36.6 C) Resp: 18 BP: 127/83 SpO2: 98 %  EBL: None  Complications: No immediate post-treatment complications observed by team, or reported by patient.  Note: The patient tolerated the entire procedure well. A repeat set of vitals were taken after the procedure and the patient was kept under observation following institutional policy, for this type of procedure. Post-procedural neurological assessment was performed, showing return to baseline,  prior to discharge. The patient was provided with post-procedure discharge instructions, including a section on how to identify potential problems. Should any problems arise concerning this procedure, the patient was given instructions to immediately contact us , at any time, without hesitation. In any case, we plan to contact the patient by telephone for a follow-up status report regarding this interventional procedure.  Comments:  No additional relevant information.  Plan of Care (POC)  Orders:  Orders Placed This Encounter  Procedures   LUMBAR FACET(MEDIAL BRANCH NERVE BLOCK) MBNB    Scheduling Instructions:     Procedure: Lumbar facet block (AKA.: Lumbosacral medial branch nerve block)     Side: Bilateral     Level: L3-4, L4-5, L5-S1, and TBD Facets (L2, L3, L4, L5, S1, and TBD Medial Branch Nerves)     Sedation: Patient's choice.     Date: 06/16/2024    Where will this procedure be performed?:   [facility]   DG [facility] C-ARM 1-60 MIN NO REPORT    Intraoperative interpretation by procedural physician at [facility].    Standing Status:   Standing    Number of Occurrences:   1    Reason for exam::   Assistance in needle guidance and placement for procedures requiring needle placement in or near specific anatomical locations not easily accessible without such assistance.   Informed Consent Details: Physician/Practitioner Attestation; Transcribe to consent form and obtain  patient signature    Nursing Order: Transcribe to consent form and obtain patient signature. Note: Always confirm laterality of pain with Ms. Hejl, before procedure.    Physician/Practitioner attestation of informed consent for procedure/surgical case:   I, the physician/practitioner, attest that I have discussed with the patient the benefits, risks, side effects, alternatives, likelihood of achieving goals and potential problems during recovery for the procedure that I have provided informed  consent.    Procedure:   Lumbar Facet Block  under fluoroscopic guidance    Physician/Practitioner performing the procedure:   Rayansh Herbst A. Tanya MD    Indication/Reason:   Low Back Pain, with our without leg pain, due to Facet Joint Arthralgia (Joint Pain) Spondylosis (Arthritis of the Spine), without myelopathy or radiculopathy (Nerve Damage).   Provide equipment / supplies at bedside    Procedure tray: Block Tray (Disposable  single use) Skin infiltration needle: Regular 1.5-in, 25-G, (x1) Block Needle type: Spinal Amount/quantity: 4 Size: Medium (5-inch) Gauge: 22G    Standing Status:   Standing    Number of Occurrences:   1    Specify:   Block Tray   Saline lock IV    Have LR [phone] mL available and administer at 125 mL/hr if patient becomes hypotensive.    Standing Status:   Standing    Number of Occurrences:   1     Opioid Analgesic:  For veterinary use: Hydromet 5 mg - 1.5 mg per 5 mL solution (180 mL) (last filled on 01/11/2024). MME/day: 30 mg/day    Medications ordered for procedure: Meds ordered this encounter  Medications   lidocaine  (XYLOCAINE ) 2 % (with pres) injection 400 mg   pentafluoroprop-tetrafluoroeth (GEBAUERS) aerosol   midazolam  (VERSED ) 5 MG/5ML injection 0.5-2 mg    Make sure Flumazenil is available in the pyxis when using this medication. If oversedation occurs, administer 0.2 mg IV over 15 sec. If after 45 sec no response, administer 0.2 mg again over 1 min; may repeat at 1 min intervals; not to exceed 4 doses (1 mg)   fentaNYL  (SUBLIMAZE ) injection 25-50 mcg    Make sure Narcan is available in the pyxis when using this medication. In the event of respiratory depression (RR< 8/min): Titrate NARCAN (naloxone) in increments of 0.1 to 0.2 mg IV at 2-3 minute intervals, until desired degree of reversal.   ropivacaine  (PF) 2 mg/mL (0.2%) (NAROPIN ) injection 18 mL   triamcinolone  acetonide (KENALOG -40) injection 80 mg   Medications administered: We  administered lidocaine , pentafluoroprop-tetrafluoroeth, midazolam , fentaNYL , ropivacaine  (PF) 2 mg/mL (0.2%), and triamcinolone  acetonide.  See the medical record for exact dosing, route, and time of administration.    Interventional Therapies  Risk Factors  Considerations  Medical Comorbidities:  BA  Endometriosis            Multilevel Facet Joint Arthropathy.    Planned  Pending:   Diagnostic bilateral lumbar facet MBB #1    Under consideration:   Diagnostic bilateral lumbar facet MBB #1    Completed:   None at this time   Therapeutic  Palliative (PRN) options:   Lumbar MRIs denied by insurance despite being ordered by multiple specialists.   Completed by other providers:   Diagnostic/therapeutic bilateral L4-5 TFESI x1 (12/27/2023) by Morene Falcon, DO [facility])        Follow-up plan:   Return in about 2 weeks (around 06/30/2024) for Eval-day (M,W), (Face2F), (PPE).     Recent Visits Date Type Provider Dept  05/04/24 Office Visit Tanya,  Eric, MD [facility]  03/23/24 Office Visit Tanya Eric, MD [facility]  Showing recent visits within past 90 days and meeting all other requirements Today's Visits Date Type Provider Dept  06/16/24 Procedure visit Tanya Eric, MD [facility]  Showing today's visits and meeting all other requirements Future Appointments Date Type Provider Dept  07/01/24 Appointment Tanya Eric, MD [facility]  Showing future appointments within next 90 days and meeting all other requirements   Disposition: Discharge home  Discharge (Date  Time): 06/16/2024; 1046 hrs.   Primary Care Physician: Diedra Lame, MD Location: [facility] Note by: Eric DELENA Tanya, MD (TTS technology used. I apologize for any typographical errors that were not detected and corrected.) Date: 06/16/2024; Time: 10:52 AM  Disclaimer:  Medicine is not an Doctor, general practice. The only guarantee in medicine is that nothing is guaranteed. It is important to note that the decision to proceed with this intervention was based on the information collected from the patient. The Data and conclusions were drawn from the patient's questionnaire, the interview, and the physical examination. Because the information was provided in large part by the patient, it cannot be guaranteed that it has not been purposely or unconsciously manipulated. Every effort has been made to obtain as much relevant data as possible for this evaluation. It is important to note that the conclusions that lead to this procedure are derived in large part from the available data. Always take into account that the treatment will also be dependent on availability of resources and existing treatment guidelines, considered by other Pain Management Practitioners as being common knowledge and practice, at the time of the intervention. For Medico-Legal purposes, it is also important to point out that variation in procedural techniques and pharmacological choices are the acceptable norm. The indications, contraindications, technique, and results of the above procedure should only be interpreted and judged by a Board-Certified Interventional Pain Specialist with extensive familiarity and expertise in the same exact procedure and technique.

## 2024-06-17 ENCOUNTER — Telehealth: Payer: Self-pay

## 2024-06-17 NOTE — Telephone Encounter (Signed)
 Post procedure follow up.  Patient states she is doing well.   ?

## 2024-06-30 NOTE — Progress Notes (Unsigned)
 PROVIDER NOTE: Interpretation of information contained herein should be left to medically-trained personnel. Specific patient instructions are provided elsewhere under Patient Instructions section of medical record. This document was created in part using AI and STT-dictation technology, any transcriptional errors that may result from this process are unintentional.  Patient: Shannon Hughes  Service: E/M   PCP: Diedra Lame, MD  DOB: 05-Jul-1966  DOS: 07/01/2024  Provider: Eric DELENA Como, MD  MRN: [id number]  Delivery: Face-to-face  Specialty: Interventional Pain Management  Type: Established Patient  Setting: Ambulatory outpatient facility  Specialty designation: 09  Referring Prov.: Diedra Lame, MD  Location: Outpatient office facility       History of present illness (HPI) [name], a 58 y.o. year old female, is here today because of her Chronic bilateral low back pain without sciatica [M54.50, G89.29]. Ms. Leyba primary complain today is No chief complaint on file.  Pertinent problems: Ms. Degrasse has Chronic pain syndrome; Lumbar spondylosis; Lumbar nerve root impingement (L2-3, L4-5); Lumbar facet arthropathy w/ effusion (L4-5); Levoscoliosis of lumbar spine (7 degrees); Lumbar foraminal stenosis (Right: L2-3); Lumbar lateral recess stenosis (Right: L2-3) (Left: L4-5); Lumbosacral facet arthropathy (L5-S1); Grade 1 (3 mm) Retrolisthesis of L3/L4; Abnormal CT scan, lumbar spine (11/22/2023); Low back pain of over 3 months duration; Multifactorial low back pain; Chronic low back pain (1ry area of Pain) (Bilateral) (R>L) w/ sciatica (Left); Chronic lower extremity pain (2ry area of Pain) (intermittent) (Left); Low back pain potentially associated with radiculopathy; Intractable low back pain; Lumbar facet joint pain (Bilateral) (R>L); Chronic lumbar radiculopathy (Left) (L3); DDD (degenerative disc disease), lumbar w/ LBP & LEP; Chronic low back pain (Bilateral) w/o sciatica;  Spondylosis without myelopathy or radiculopathy, lumbar region; and Grade 1 Anterolisthesis of L4/L5 on their pertinent problem list.  Pain Assessment: Severity of   is reported as a  /10. Location:    / . Onset:  . Quality:  . Timing:  . Modifying factor(s):  SABRA Vitals:  vitals were not taken for this visit.  BMI: Estimated body mass index is 35.54 kg/m as calculated from the following:   Height as of 06/16/24: 5' (1.524 m).   Weight as of 06/16/24: 182 lb (82.6 kg).  Last encounter: 05/04/2024. Last procedure: 06/16/2024.  Reason for encounter: post-procedure evaluation and assessment.   Discussed the use of AI scribe software for clinical note transcription with the patient, who gave verbal consent to proceed.  History of Present Illness          Post-Procedure Evaluation   Type: Lumbar Facet, Medial Branch Block(s) (w/ fluoroscopic mapping) #1  Laterality: Bilateral  Level: L2, L3, L4, L5, and S1 Medial Branch/Dorsal Rami Level(s). Injecting these levels blocks the L3-4, L4-5, and L5-S1 lumbar facet joints. Imaging: Fluoroscopic guidance Spinal ([id number]) Anesthesia: Local anesthesia (1-2% Lidocaine ) Anxiolysis: IV Versed  3.0 mg Sedation: Minimal Sedation Fentanyl  1 mL (50 mcg) DOS: 06/16/2024 Performed by: Eric DELENA Como, MD  Primary Purpose: Diagnostic/Therapeutic Indications: Low back pain severe enough to impact quality of life or function. 1. Chronic low back pain (Bilateral) w/o sciatica   2. Low back pain of over 3 months duration   3. Grade 1 (3 mm) Retrolisthesis of L3/L4   4. Grade 1 Anterolisthesis of L4/L5   5. Lumbar facet arthropathy w/ effusion (L4-5)   6. Lumbar facet joint pain (Bilateral) (R>L)   7. Levoscoliosis of lumbar spine (7 degrees)   8. Lumbosacral facet arthropathy (L5-S1)   9. Spondylosis without myelopathy or radiculopathy,  lumbar region   10. Multifactorial low back pain    NAS-11 Pain score:   Pre-procedure: 8 /10   Post-procedure: 0-No  pain/10   Note: Very anxious w/ procedure. Occasional PVC's controlled w/ deep breaths.     Effectiveness:  Initial hour after procedure:   ***. Subsequent 4-6 hours post-procedure:   ***. Analgesia past initial 6 hours:   ***. Ongoing improvement:  Analgesic:  *** Function:    ***    ROM:    ***     Pharmacotherapy Assessment   Opioid Analgesic:  For veterinary use: Hydromet 5 mg - 1.5 mg per 5 mL solution (180 mL) (last filled on 01/11/2024). MME/day: 30 mg/day   Monitoring: [place] PMP: PDMP reviewed during this encounter.       Pharmacotherapy: No side-effects or adverse reactions reported. Compliance: No problems identified. Effectiveness: Clinically acceptable.  No notes on file  UDS:  Summary  Date Value Ref Range Status  03/23/2024 FINAL  Final    Comment:    ==================================================================== Compliance Drug Analysis, Ur ==================================================================== Test                             Result       Flag       Units  Drug Present and Declared for Prescription Verification   Acetaminophen                  PRESENT      EXPECTED ==================================================================== Test                      Result    Flag   Units      Ref Range   Creatinine              67               mg/dL      >=79 ==================================================================== Declared Medications:  The flagging and interpretation on this report are based on the  following declared medications.  Unexpected results may arise from  inaccuracies in the declared medications.   **Note: The testing scope of this panel does not include small to  moderate amounts of these reported medications:   Acetaminophen (Tylenol)   **Note: The testing scope of this panel does not include the  following reported medications:   Albuterol (Ventolin HFA)  Meloxicam (Mobic)  Montelukast (Singulair)   Multivitamin  Vitamin D  ==================================================================== For clinical consultation, please call [phone]. ====================================================================     No results found for: CBDTHCR No results found for: D8THCCBX No results found for: D9THCCBX  ROS  Constitutional: Denies any fever or chills Gastrointestinal: No reported hemesis, hematochezia, vomiting, or acute GI distress Musculoskeletal: Denies any acute onset joint swelling, redness, loss of ROM, or weakness Neurological: No reported episodes of acute onset apraxia, aphasia, dysarthria, agnosia, amnesia, paralysis, loss of coordination, or loss of consciousness  Medication Review  OVER THE COUNTER MEDICATION, acetaminophen, albuterol, cholecalciferol, cyanocobalamin, meloxicam, metFORMIN, and montelukast  History Review  Allergy: Ms. Labreck is allergic to penicillins. Drug: Ms. Dixson  reports no history of drug use. Alcohol:  reports current alcohol use. Tobacco:  reports that she has never smoked. She has never used smokeless tobacco. Social: Ms. Franzen  reports that she has never smoked. She has never used smokeless tobacco. She reports current alcohol use. She reports that she does not use drugs. Medical:  has a past medical history  of Asthma, BRCA negative (08/2018), Endometriosis, Family history of breast cancer, Increased risk of breast cancer (08/2018), Left elbow fracture, and Nodule of left lung. Surgical: Ms. Pavon  has a past surgical history that includes Elbow surgery; Tonsillectomy and adenoidectomy; and Laparoscopic total hysterectomy. Family: family history includes Breast cancer (age of onset: 10) in her maternal aunt; Breast cancer (age of onset: 21) in her maternal grandmother; Breast cancer (age of onset: 19) in her cousin; Cancer (age of onset: 66) in her maternal uncle; Diabetes in her mother; Stroke in her mother.  Laboratory  Chemistry Profile   Renal Lab Results  Component Value Date   BUN 17 03/23/2024   CREATININE 0.76 03/23/2024   BCR 22 03/23/2024    Hepatic Lab Results  Component Value Date   AST 19 03/23/2024   ALBUMIN 4.6 03/23/2024   ALKPHOS 98 03/23/2024    Electrolytes Lab Results  Component Value Date   NA 140 03/23/2024   K 4.3 03/23/2024   CL 100 03/23/2024   CALCIUM 9.9 03/23/2024   MG 1.9 03/23/2024    Bone Lab Results  Component Value Date   25OHVITD1 29 (L) 03/23/2024   25OHVITD2 <1.0 03/23/2024   25OHVITD3 29 03/23/2024    Inflammation (CRP: Acute Phase) (ESR: Chronic Phase) Lab Results  Component Value Date   CRP 7 03/23/2024   ESRSEDRATE 30 03/23/2024         Note: Above Lab results reviewed.  Recent Imaging Review  DG PAIN CLINIC C-ARM 1-60 MIN NO REPORT Fluoro was used, but no Radiologist interpretation will be provided.  Please refer to NOTES tab for provider progress note. Note: Reviewed        Physical Exam  Vitals: There were no vitals taken for this visit. BMI: Estimated body mass index is 35.54 kg/m as calculated from the following:   Height as of 06/16/24: 5' (1.524 m).   Weight as of 06/16/24: 182 lb (82.6 kg). Ideal: Ideal body weight: 45.5 kg (100 lb 4.9 oz) Adjusted ideal body weight: 60.3 kg (132 lb 15.8 oz) General appearance: Well nourished, well developed, and well hydrated. In no apparent acute distress Mental status: Alert, oriented x 3 (person, place, & time)       Respiratory: No evidence of acute respiratory distress Eyes: PERLA   Assessment   Diagnosis Status  1. Chronic low back pain (Bilateral) w/o sciatica   2. Low back pain of over 3 months duration   3. Lumbar facet joint pain (Bilateral) (R>L)   4. Postop check    Controlled Controlled Controlled   Updated Problems: No problems updated.  Plan of Care  Problem-specific:  Assessment and Plan            Ms. KYLER LERETTE has a current medication list which  includes the following long-term medication(s): albuterol, metformin, and montelukast.  Pharmacotherapy (Medications Ordered): No orders of the defined types were placed in this encounter.  Orders:  No orders of the defined types were placed in this encounter.    Interventional Therapies  Risk Factors  Considerations  Medical Comorbidities:  BA  Endometriosis            Multilevel Facet Joint Arthropathy.    Planned  Pending:   Diagnostic bilateral lumbar facet MBB #1    Under consideration:   Diagnostic bilateral lumbar facet MBB #1    Completed:   None at this time   Therapeutic  Palliative (PRN) options:   Lumbar MRIs denied by  insurance despite being ordered by multiple specialists.   Completed by other providers:   Diagnostic/therapeutic bilateral L4-5 TFESI x1 (12/27/2023) by Morene Falcon, DO [facility] PMR)       No follow-ups on file.    Recent Visits Date Type Provider Dept  06/16/24 Procedure visit Tanya Glisson, MD [facility]  05/04/24 Office Visit Tanya Glisson, MD [facility]  Showing recent visits within past 90 days and meeting all other requirements Future Appointments Date Type Provider Dept  07/01/24 Appointment Tanya Glisson, MD [facility]  Showing future appointments within next 90 days and meeting all other requirements  I discussed the assessment and treatment plan with the patient. The patient was provided an opportunity to ask questions and all were answered. The patient agreed with the plan and demonstrated an understanding of the instructions.  Patient advised to call back or seek an in-person evaluation if the symptoms or condition worsens.  Duration of encounter: *** minutes.  Total time on encounter, as per AMA guidelines included both the face-to-face and non-face-to-face time personally spent by the physician and/or other qualified health care professional(s) on the day of the  encounter (includes time in activities that require the physician or other qualified health care professional and does not include time in activities normally performed by clinical staff). Physician's time may include the following activities when performed: Preparing to see the patient (e.g., pre-charting review of records, searching for previously ordered imaging, lab work, and nerve conduction tests) Review of prior analgesic pharmacotherapies. Reviewing PMP Interpreting ordered tests (e.g., lab work, imaging, nerve conduction tests) Performing post-procedure evaluations, including interpretation of diagnostic procedures Obtaining and/or reviewing separately obtained history Performing a medically appropriate examination and/or evaluation Counseling and educating the patient/family/caregiver Ordering medications, tests, or procedures Referring and communicating with other health care professionals (when not separately reported) Documenting clinical information in the electronic or other health record Independently interpreting results (not separately reported) and communicating results to the patient/ family/caregiver Care coordination (not separately reported)  Note by: Glisson DELENA Tanya, MD (TTS and AI technology used. I apologize for any typographical errors that were not detected and corrected.) Date: 07/01/2024; Time: 7:26 AM

## 2024-07-01 ENCOUNTER — Ambulatory Visit (HOSPITAL_BASED_OUTPATIENT_CLINIC_OR_DEPARTMENT_OTHER): Admitting: Pain Medicine

## 2024-07-01 DIAGNOSIS — M545 Low back pain, unspecified: Secondary | ICD-10-CM

## 2024-07-01 DIAGNOSIS — Z91199 Patient's noncompliance with other medical treatment and regimen due to unspecified reason: Secondary | ICD-10-CM

## 2024-07-01 DIAGNOSIS — Z09 Encounter for follow-up examination after completed treatment for conditions other than malignant neoplasm: Secondary | ICD-10-CM

## 2024-07-01 DIAGNOSIS — M5459 Other low back pain: Secondary | ICD-10-CM

## 2024-12-09 ENCOUNTER — Encounter: Payer: Self-pay | Admitting: Pain Medicine

## 2024-12-09 ENCOUNTER — Ambulatory Visit: Attending: Pain Medicine | Admitting: Pain Medicine

## 2024-12-09 VITALS — BP 115/75 | HR 87 | Resp 16 | Ht 62.0 in | Wt 182.0 lb

## 2024-12-09 DIAGNOSIS — M4316 Spondylolisthesis, lumbar region: Secondary | ICD-10-CM | POA: Diagnosis not present

## 2024-12-09 DIAGNOSIS — M47819 Spondylosis without myelopathy or radiculopathy, site unspecified: Secondary | ICD-10-CM | POA: Diagnosis not present

## 2024-12-09 DIAGNOSIS — M47816 Spondylosis without myelopathy or radiculopathy, lumbar region: Secondary | ICD-10-CM | POA: Insufficient documentation

## 2024-12-09 DIAGNOSIS — G8929 Other chronic pain: Secondary | ICD-10-CM | POA: Diagnosis not present

## 2024-12-09 DIAGNOSIS — M545 Low back pain, unspecified: Secondary | ICD-10-CM | POA: Insufficient documentation

## 2024-12-09 DIAGNOSIS — M2540 Effusion, unspecified joint: Secondary | ICD-10-CM | POA: Diagnosis not present

## 2024-12-09 DIAGNOSIS — M5459 Other low back pain: Secondary | ICD-10-CM | POA: Diagnosis not present

## 2024-12-09 DIAGNOSIS — M4310 Spondylolisthesis, site unspecified: Secondary | ICD-10-CM | POA: Insufficient documentation

## 2024-12-09 DIAGNOSIS — M47817 Spondylosis without myelopathy or radiculopathy, lumbosacral region: Secondary | ICD-10-CM | POA: Diagnosis not present

## 2024-12-09 DIAGNOSIS — M4186 Other forms of scoliosis, lumbar region: Secondary | ICD-10-CM | POA: Diagnosis not present

## 2024-12-09 DIAGNOSIS — Z09 Encounter for follow-up examination after completed treatment for conditions other than malignant neoplasm: Secondary | ICD-10-CM | POA: Insufficient documentation

## 2024-12-09 NOTE — Progress Notes (Signed)
 PROVIDER NOTE: Interpretation of information contained herein should be left to medically-trained personnel. Specific patient instructions are provided elsewhere under Patient Instructions section of medical record. This document was created in part using AI and STT-dictation technology, any transcriptional errors that may result from this process are unintentional.  Patient: Shannon Hughes  Service: E/M   PCP: Diedra Lame, MD  DOB: [birth year]  DOS: 12/09/2024  Provider: Eric DELENA Como, MD  MRN: [id number]  Delivery: Face-to-face  Specialty: Interventional Pain Management  Type: Established Patient  Setting: Ambulatory outpatient facility  Specialty designation: 09  Referring Prov.: Diedra Lame, MD  Location: Outpatient office facility       History of present illness (HPI) [name], a 59 y.o. year old female, is here today because of her Chronic bilateral low back pain without sciatica [M54.50, G89.29]. Shannon Hughes primary complain today is Back Pain (Lower right )  Pertinent problems: Shannon Hughes has Chronic pain syndrome; Lumbar spondylosis; Lumbar nerve root impingement (L2-3, L4-5); Lumbar facet arthropathy w/ effusion (L4-5); Levoscoliosis of lumbar spine (7 degrees); Lumbar foraminal stenosis (Right: L2-3); Lumbar lateral recess stenosis (Right: L2-3) (Left: L4-5); Lumbosacral facet arthropathy (L5-S1); Grade 1 (3 mm) Retrolisthesis of L3/L4; Abnormal CT scan, lumbar spine (11/22/2023); Low back pain of over 3 months duration; Multifactorial low back pain; Chronic low back pain (1ry area of Pain) (Bilateral) (R>L) w/ sciatica (Left); Chronic lower extremity pain (2ry area of Pain) (intermittent) (Left); Low back pain potentially associated with radiculopathy; Intractable low back pain; Lumbar facet joint pain (Bilateral) (R>L); Chronic lumbar radiculopathy (Left) (L3); DDD (degenerative disc disease), lumbar w/ LBP & LEP; Chronic low back pain (Bilateral) w/o sciatica;  Spondylosis without myelopathy or radiculopathy, lumbar region; and Grade 1 Anterolisthesis of L4/L5 on their pertinent problem list.  Pain Assessment: Severity of Chronic pain is reported as a 8 /10. Location: Back Lower, Right/Denies. Onset: More than a month ago. Quality: Constant, Aching, Sharp. Timing: Constant. Modifying factor(s): Heating pad and tylenol. Vitals:  height is 5' 2 (1.575 m) and weight is 182 lb (82.6 kg). Her blood pressure is 115/75 and her pulse is 87. Her respiration is 16 and oxygen saturation is 99%.  BMI: Estimated body mass index is 33.29 kg/m as calculated from the following:   Height as of this encounter: 5' 2 (1.575 m).   Weight as of this encounter: 182 lb (82.6 kg).  Last encounter: 05/04/2024. Last procedure: 06/16/2024.  Reason for encounter: post-procedure evaluation and assessment.  The patient was scheduled to return on 07/01/2024 for the initial postprocedure evaluation, but the patient did not keep that appointment.  She now comes back indicating that she had 100% relief of the pain for the duration of local anesthetic followed by a slight improvement of the pain for 1 week after which the pain returned.  Unfortunately, he has been seen that recall from diagnostic procedures is accurate to about 2 weeks and because the patient did not keep her follow-up appointment, we have lost a significant amount of diagnostic information.  Discussed the use of AI scribe software for clinical note transcription with the patient, who gave verbal consent to proceed.  History of Present Illness   Shannon Hughes is a 59 year old female who presents for follow-up after a diagnostic lumbar facet block.  She has bilateral low back pain without sciatica for over three months. Imaging shows grade one retrolisthesis of L3 on L4, grade one anterolisthesis of L4 on L5, and lumbar and  lumbosacral facet arthropathy with effusion at L4-5 and L5-S1.  On June 16, 2024, she underwent a  diagnostic bilateral lumbar facet block under fluoroscopic guidance. She had complete pain relief while the local anesthetic was active, with mild improvement lasting about one week and gradual return of pain by two weeks.  Before the block her pain was 8/10 and dropped to 0/10 immediately after. The numbing effect was expected to last 6 to 8 hours, and she reported the pain returning to baseline about two to three weeks later.      Post-Procedure Evaluation   Type: Lumbar Facet, Medial Branch Block(s) (w/ fluoroscopic mapping) #1  Laterality: Bilateral  Level: L2, L3, L4, L5, and S1 Medial Branch/Dorsal Rami Level(s). Injecting these levels blocks the L3-4, L4-5, and L5-S1 lumbar facet joints. Imaging: Fluoroscopic guidance Spinal ([id number]) Anesthesia: Local anesthesia (1-2% Lidocaine ) Anxiolysis: IV Versed  3.0 mg Sedation: Minimal Sedation Fentanyl  1 mL (50 mcg) DOS: 06/16/2024 Performed by: Eric DELENA Como, MD  Primary Purpose: Diagnostic/Therapeutic Indications: Low back pain severe enough to impact quality of life or function. 1. Chronic low back pain (Bilateral) w/o sciatica   2. Low back pain of over 3 months duration   3. Grade 1 (3 mm) Retrolisthesis of L3/L4   4. Grade 1 Anterolisthesis of L4/L5   5. Lumbar facet arthropathy w/ effusion (L4-5)   6. Lumbar facet joint pain (Bilateral) (R>L)   7. Levoscoliosis of lumbar spine (7 degrees)   8. Lumbosacral facet arthropathy (L5-S1)   9. Spondylosis without myelopathy or radiculopathy, lumbar region   10. Multifactorial low back pain    NAS-11 Pain score:   Pre-procedure: 8 /10   Post-procedure: 0-No pain/10   Note: Very anxious w/ procedure. Occasional PVC's controlled w/ deep breaths.     Effectiveness:  Initial hour after procedure: 100 %. Subsequent 4-6 hours post-procedure: 100 %. Analgesia past initial 6 hours: 100 % x 3 weeks (pain gradually returned). Ongoing improvement:  Analgesic: Further questioning has  revealed the patient to have experienced 100% relief of the pain for the duration of local anesthetic which in fact persisted for an additional 3 weeks after which the pain slowly returned. Function: Shannon Hughes reports improvement in function ROM: Shannon Hughes reports improvement in ROM Interpretation: Based on the above results, the diagnostic portion of the procedure has confirmed the patient to have symptomatic lumbar facet disease.  Today I went over the possible alternatives including doing a second diagnostic procedure which if effective, but with no long-term benefit, then could be followed by radiofrequency ablation.  Pharmacotherapy Assessment   Opioid Analgesic:  For veterinary use: Hydromet 5 mg - 1.5 mg per 5 mL solution (180 mL) (last filled on 01/11/2024). MME/day: 30 mg/day   Monitoring: [place] PMP: PDMP reviewed during this encounter.       Pharmacotherapy: No side-effects or adverse reactions reported. Compliance: No problems identified. Effectiveness: Clinically acceptable.  No notes on file  UDS:  Summary  Date Value Ref Range Status  03/23/2024 FINAL  Final    Comment:    ==================================================================== Compliance Drug Analysis, Ur ==================================================================== Test                             Result       Flag       Units  Drug Present and Declared for Prescription Verification   Acetaminophen  PRESENT      EXPECTED ==================================================================== Test                      Result    Flag   Units      Ref Range   Creatinine              67               mg/dL      >=79 ==================================================================== Declared Medications:  The flagging and interpretation on this report are based on the  following declared medications.  Unexpected results may arise from  inaccuracies in the declared medications.    **Note: The testing scope of this panel does not include small to  moderate amounts of these reported medications:   Acetaminophen (Tylenol)   **Note: The testing scope of this panel does not include the  following reported medications:   Albuterol (Ventolin HFA)  Meloxicam (Mobic)  Montelukast (Singulair)  Multivitamin  Vitamin D  ==================================================================== For clinical consultation, please call [phone]. ====================================================================     No results found for: CBDTHCR No results found for: D8THCCBX No results found for: D9THCCBX  ROS  Constitutional: Denies any fever or chills Gastrointestinal: No reported hemesis, hematochezia, vomiting, or acute GI distress Musculoskeletal: Denies any acute onset joint swelling, redness, loss of ROM, or weakness Neurological: No reported episodes of acute onset apraxia, aphasia, dysarthria, agnosia, amnesia, paralysis, loss of coordination, or loss of consciousness  Medication Review  OVER THE COUNTER MEDICATION, acetaminophen, albuterol, cholecalciferol, cyanocobalamin, meloxicam, metFORMIN, and montelukast  History Review  Allergy: Shannon Hughes is allergic to penicillins. Drug: Shannon Hughes  reports no history of drug use. Alcohol:  reports current alcohol use. Tobacco:  reports that she has never smoked. She has never used smokeless tobacco. Social: Shannon Hughes  reports that she has never smoked. She has never used smokeless tobacco. She reports current alcohol use. She reports that she does not use drugs. Medical:  has a past medical history of Asthma, BRCA negative (08/2018), Endometriosis, Family history of breast cancer, Increased risk of breast cancer (08/2018), Left elbow fracture, and Nodule of left lung. Surgical: Shannon Hughes  has a past surgical history that includes Elbow surgery; Tonsillectomy and adenoidectomy; and Laparoscopic total  hysterectomy. Family: family history includes Breast cancer (age of onset: 33) in her maternal aunt; Breast cancer (age of onset: 78) in her maternal grandmother; Breast cancer (age of onset: 64) in her cousin; Cancer (age of onset: 30) in her maternal uncle; Diabetes in her mother; Stroke in her mother.  Laboratory Chemistry Profile   Renal Lab Results  Component Value Date   BUN 17 03/23/2024   CREATININE 0.76 03/23/2024   BCR 22 03/23/2024    Hepatic Lab Results  Component Value Date   AST 19 03/23/2024   ALBUMIN 4.6 03/23/2024   ALKPHOS 98 03/23/2024    Electrolytes Lab Results  Component Value Date   NA 140 03/23/2024   K 4.3 03/23/2024   CL 100 03/23/2024   CALCIUM 9.9 03/23/2024   MG 1.9 03/23/2024    Bone Lab Results  Component Value Date   25OHVITD1 29 (L) 03/23/2024   25OHVITD2 <1.0 03/23/2024   25OHVITD3 29 03/23/2024    Inflammation (CRP: Acute Phase) (ESR: Chronic Phase) Lab Results  Component Value Date   CRP 7 03/23/2024   ESRSEDRATE 30 03/23/2024         Note: Above Lab results reviewed.  Recent Imaging Review  DG PAIN [facility] C-ARM 1-60 MIN NO REPORT Fluoro was used, but no Radiologist interpretation will be provided.  Please refer to NOTES tab for provider progress note. Note: Reviewed        Physical Exam  Vitals: BP 115/75 (Patient Position: Sitting, Cuff Size: Normal)   Pulse 87   Resp 16   Ht 5' 2 (1.575 m)   Wt 182 lb (82.6 kg)   SpO2 99%   BMI 33.29 kg/m  BMI: Estimated body mass index is 33.29 kg/m as calculated from the following:   Height as of this encounter: 5' 2 (1.575 m).   Weight as of this encounter: 182 lb (82.6 kg). Ideal: Ideal body weight: 50.1 kg (110 lb 7.2 oz) Adjusted ideal body weight: 63.1 kg (139 lb 1.1 oz) General appearance: Well nourished, well developed, and well hydrated. In no apparent acute distress Mental status: Alert, oriented x 3 (person, place, & time)       Respiratory: No evidence of acute  respiratory distress Eyes: PERLA   Assessment   Diagnosis Status  1. Chronic low back pain (Bilateral) w/o sciatica   2. Low back pain of over 3 months duration   3. Lumbar facet joint pain (Bilateral) (R>L)   4. Grade 1 (3 mm) Retrolisthesis of L3/L4   5. Grade 1 Anterolisthesis of L4/L5   6. Levoscoliosis of lumbar spine (7 degrees)   7. Lumbar facet arthropathy w/ effusion (L4-5)   8. Lumbosacral facet arthropathy (L5-S1)   9. Multifactorial low back pain   10. Spondylosis without myelopathy or radiculopathy, lumbar region   11. Postop check    Controlled Controlled Controlled   Updated Problems: No problems updated.  Plan of Care  Problem-specific:  Assessment and Plan    Chronic low back pain due to lumbar facet arthropathy and spondylolisthesis   Chronic bilateral low back pain persists without sciatica for over three months. Imaging shows lumbar facet arthropathy with effusion at L4-5 and lumbosacral facet arthropathy at L5-S1, with grade one retrolisthesis of L3 over L4 and grade one anterolisthesis of L4 over L5. A previous diagnostic bilateral lumbar facet block provided 100% relief during local anesthetic, with slight improvement lasting about a week before pain gradually returned. Numbing medicine confirmed the pain originates at the facet joints. Steroid use provided some relief, indicating swelling reduction. Long-term management may involve radiofrequency ablation if injections do not provide sustained relief. This procedure creates a thermal lesion to relieve pain for 3 to 18 months and is repeatable as needed. A second diagnostic lumbar facet block is ordered to confirm pain origin and gather insurance approval for radiofrequency ablation. Radiofrequency ablation will be scheduled if the second injection results are similar to the first and insurance approves. An after-visit summary with preparation instructions for the procedure, including fasting for 8 hours prior,  was provided.  Post-procedure follow-up evaluation   She missed the follow-up evaluation two weeks post-procedure, which is critical for diagnostic accuracy and insurance approval for further treatment. This may have reduced diagnostic accuracy and delayed treatment planning. A follow-up evaluation is scheduled to gather diagnostic information and present the case to insurance for approval of further treatment.       Shannon Hughes has a current medication list which includes the following long-term medication(s): albuterol, metformin, and montelukast.  Pharmacotherapy (Medications Ordered): No orders of the defined types were placed in this encounter.  Orders:  Orders Placed This Encounter  Procedures   LUMBAR FACET(MEDIAL BRANCH NERVE BLOCK)  MBNB    Diagnosis: Lumbar Facet Syndrome (M47.816); Lumbosacral Facet Syndrome (M47.817); Lumbar Facet Joint Pain (M54.59) Medical Necessity Statement: 1.Severe chronic axial low back pain causing functional impairment documented by ongoing pain scale assessments. 2.Pain present for longer than 3 months (Chronic) documented to have failed noninvasive conservative therapies. 3.Absence of untreated radiculopathy. 4.There is no radiological evidence of untreated fractures, tumor, infection, or deformity.  Physical Examination Findings: Positive Kemp Maneuver: (Y)  Positive Lumbar Hyperextension-Rotation provocative test: (Y)    Standing Status:   Future    Expiration Date:   03/09/2025    Scheduling Instructions:     Procedure: Lumbar facet Block     Type: Medial Branch Block     Side: Bilateral     Purpose: Diagnostic/Therapeutic     Level(s): L3-4, L4-5, and L5-S1 Facets (L2, L3, L4, L5, and S1 Medial Branch)     Procedural Analgesia/Anxiolysis: Patient's choice     Timeframe: As soon as schedule allows.    Where will this procedure be performed?:   [facility]   Nursing Instructions:    Please complete this patient's  postprocedure evaluation.    Scheduling Instructions:     Please complete this patient's postprocedure evaluation.     Interventional Therapies  Risk Factors  Considerations  Medical Comorbidities:  BA  Endometriosis            Multilevel Facet Joint Arthropathy.    Planned  Pending:   Diagnostic bilateral lumbar facet MBB #2    Under consideration:   Diagnostic bilateral lumbar facet MBB #2 with possible follow-up RFA    Completed:   Diagnostic bilateral lumbar facet MBB x1 (06/16/2024) (100/100/100 x 3 weeks/0)   Therapeutic  Palliative (PRN) options:   Lumbar MRIs denied by insurance despite being ordered by multiple specialists.   Completed by other providers:   Diagnostic/therapeutic bilateral L4-5 TFESI x1 (12/27/2023) by Morene Falcon, DO [facility])       Return for [facility]): (B) L-FCT Blk #2.    Recent Visits No visits were found meeting these conditions. Showing recent visits within past 90 days and meeting all other requirements Today's Visits Date Type Provider Dept  12/09/24 Office Visit Tanya Glisson, MD [facility]  Showing today's visits and meeting all other requirements Future Appointments No visits were found meeting these conditions. Showing future appointments within next 90 days and meeting all other requirements  I discussed the assessment and treatment plan with the patient. The patient was provided an opportunity to ask questions and all were answered. The patient agreed with the plan and demonstrated an understanding of the instructions.  Patient advised to call back or seek an in-person evaluation if the symptoms or condition worsens.  Duration of encounter: 34 minutes.  Total time on encounter, as per AMA guidelines included both the face-to-face and non-face-to-face time personally spent by the physician and/or other qualified health care professional(s) on the day of the encounter (includes time in activities that  require the physician or other qualified health care professional and does not include time in activities normally performed by clinical staff). Physician's time may include the following activities when performed: Preparing to see the patient (e.g., pre-charting review of records, searching for previously ordered imaging, lab work, and nerve conduction tests) Review of prior analgesic pharmacotherapies. Reviewing PMP Interpreting ordered tests (e.g., lab work, imaging, nerve conduction tests) Performing post-procedure evaluations, including interpretation of diagnostic procedures Obtaining and/or reviewing separately obtained history Performing a medically appropriate examination  and/or evaluation Counseling and educating the patient/family/caregiver Ordering medications, tests, or procedures Referring and communicating with other health care professionals (when not separately reported) Documenting clinical information in the electronic or other health record Independently interpreting results (not separately reported) and communicating results to the patient/ family/caregiver Care coordination (not separately reported)  Note by: Eric DELENA Como, MD (TTS and AI technology used. I apologize for any typographical errors that were not detected and corrected.) Date: 12/09/2024; Time: 3:10 PM

## 2024-12-09 NOTE — Patient Instructions (Signed)
 " ______________________________________________________________________    Procedure instructions  Stop blood-thinners  Do not eat or drink fluids (other than water ) for 8 hours before your procedure  No water  for 2 hours before your procedure  Take your blood pressure medicine with a sip of water   Arrive 30 minutes before your appointment  If sedation is planned, bring suitable driver. [place], [place], & public transportation are NOT APPROVED)  Carefully read the Preparing for your procedure detailed instructions  If you have questions call us  at [phone]  Procedure appointments are for procedures only.   NO medication refills or new problem evaluations will be done on procedure days.   Only the scheduled, pre-approved procedure and side will be done.   ______________________________________________________________________     ______________________________________________________________________    Preparing for your procedure  Appointments: If you think you may not be able to keep your appointment, call 24-48 hours in advance to cancel. We need time to make it available to others.  Procedure visits are for procedures only. During your procedure appointment there will be: NO Prescription Refills*. NO medication changes or discussions*. NO discussion of disability issues*. NO unrelated pain problem evaluations*. NO evaluations to order other pain procedures*. *These will be addressed at a separate and distinct evaluation encounter on the provider's evaluation schedule and not during procedure days.  Instructions: Food intake: Avoid eating anything solid for at least 8 hours prior to your procedure. Clear liquid intake: You may take clear liquids such as water  up to 2 hours prior to your procedure. (No carbonated drinks. No soda.) Transportation: Unless otherwise stated by your physician, bring a driver. (Driver cannot be a [employer]earcher, Pharmacist, Community, or any other form of public  transportation.) Morning Medicines: Except for blood thinners, take all of your other morning medications with a sip of water . Make sure to take your heart and blood pressure medicines. If your blood pressure's lower number is above 100, the case will be rescheduled. Blood thinners: Make sure to stop your blood thinners as instructed.  If you take a blood thinner, but were not instructed to stop it, call our office [phone] and ask to talk to a nurse. Not stopping a blood thinner prior to certain procedures could lead to serious complications. Diabetics on insulin: Notify the staff so that you can be scheduled 1st case in the morning. If your diabetes requires high dose insulin, take only  of your normal insulin dose the morning of the procedure and notify the staff that you have done so. Preventing infections: Shower with an antibacterial soap the morning of your procedure.  Build-up your immune system: Take 1000 mg of Vitamin C with every meal (3 times a day) the day prior to your procedure. Antibiotics: Inform the nursing staff if you are taking any antibiotics or if you have any conditions that may require antibiotics prior to procedures. (Example: recent joint implants)   Pregnancy: If you are pregnant make sure to notify the nursing staff. Not doing so may result in injury to the fetus, including death.  Sickness: If you have a cold, fever, or any active infections, call and cancel or reschedule your procedure. Receiving steroids while having an infection may result in complications. Arrival: You must be in the facility at least 30 minutes prior to your scheduled procedure. Tardiness: Your scheduled time is also the cutoff time. If you do not arrive at least 15 minutes prior to your procedure, you will be rescheduled.  Children: Do not bring any children  with you. Make arrangements to keep them home. Dress appropriately: There is always a possibility that your clothing may get soiled. Avoid  long dresses. Valuables: Do not bring any jewelry or valuables.  Reasons to call and reschedule or cancel your procedure: (Following these recommendations will minimize the risk of a serious complication.) Surgeries: Avoid having procedures within 2 weeks of any surgery. (Avoid for 2 weeks before or after any surgery). Flu Shots: Avoid having procedures within 2 weeks of a flu shots or . (Avoid for 2 weeks before or after immunizations). Barium: Avoid having a procedure within 7-10 days after having had a radiological study involving the use of radiological contrast. (Myelograms, Barium swallow or enema study). Heart attacks: Avoid any elective procedures or surgeries for the initial 6 months after a Myocardial Infarction (Heart Attack). Blood thinners: It is imperative that you stop these medications before procedures. Let us  know if you if you take any blood thinner.  Infection: Avoid procedures during or within two weeks of an infection (including chest colds or gastrointestinal problems). Symptoms associated with infections include: Localized redness, fever, chills, night sweats or profuse sweating, burning sensation when voiding, cough, congestion, stuffiness, runny nose, sore throat, diarrhea, nausea, vomiting, cold or Flu symptoms, recent or current infections. It is specially important if the infection is over the area that we intend to treat. Heart and lung problems: Symptoms that may suggest an active cardiopulmonary problem include: cough, chest pain, breathing difficulties or shortness of breath, dizziness, ankle swelling, uncontrolled high or unusually low blood pressure, and/or palpitations. If you are experiencing any of these symptoms, cancel your procedure and contact your primary care physician for an evaluation.  Remember:  Regular Business hours are:  Monday to Thursday 8:00 AM to 4:00 PM  Provider's Schedule: Eric Como, MD:  Procedure days: Tuesday and Thursday 7:30  AM to 4:00 PM  Wallie Sherry, MD:  Procedure days: Monday and Wednesday 7:30 AM to 4:00 PM Last  Updated: 10/22/2023 ______________________________________________________________________     ______________________________________________________________________    General Risks and Possible Complications  Patient Responsibilities: It is important that you read this as it is part of your informed consent. It is our duty to inform you of the risks and possible complications associated with treatments offered to you. It is your responsibility as a patient to read this and to ask questions about anything that is not clear or that you believe was not covered in this document.  Patients Rights: You have the right to refuse treatment. You also have the right to change your mind, even after initially having agreed to have the treatment done. However, under this last option, if you wait until the last second to change your mind, you may be charged for the materials used up to that point.  Introduction: Medicine is not an visual merchandiser. Everything in Medicine, including the lack of treatment(s), carries the potential for danger, harm, or loss (which is by definition: Risk). In Medicine, a complication is a secondary problem, condition, or disease that can aggravate an already existing one. All treatments carry the risk of possible complications. The fact that a side effects or complications occurs, does not imply that the treatment was conducted incorrectly. It must be clearly understood that these can happen even when everything is done following the highest safety standards.  No treatment: You can choose not to proceed with the proposed treatment alternative. The PRO(s) would include: avoiding the risk of complications associated with the therapy. The CON(s) would  include: not getting any of the treatment benefits. These benefits fall under one of three categories: diagnostic; therapeutic; and/or  palliative. Diagnostic benefits include: getting information which can ultimately lead to improvement of the disease or symptom(s). Therapeutic benefits are those associated with the successful treatment of the disease. Finally, palliative benefits are those related to the decrease of the primary symptoms, without necessarily curing the condition (example: decreasing the pain from a flare-up of a chronic condition, such as incurable terminal cancer).  General Risks and Complications: These are associated to most interventional treatments. They can occur alone, or in combination. They fall under one of the following six (6) categories: no benefit or worsening of symptoms; bleeding; infection; nerve damage; allergic reactions; and/or death. No benefits or worsening of symptoms: In Medicine there are no guarantees, only probabilities. No healthcare provider can ever guarantee that a medical treatment will work, they can only state the probability that it may. Furthermore, there is always the possibility that the condition may worsen, either directly, or indirectly, as a consequence of the treatment. Bleeding: This is more common if the patient is taking a blood thinner, either prescription or over the counter (example: Goody Powders, Fish oil, Aspirin , Garlic, etc.), or if suffering a condition associated with impaired coagulation (example: Hemophilia, cirrhosis of the liver, low platelet counts, etc.). However, even if you do not have one on these, it can still happen. If you have any of these conditions, or take one of these drugs, make sure to notify your treating physician. Infection: This is more common in patients with a compromised immune system, either due to disease (example: diabetes, cancer, human immunodeficiency virus [HIV], etc.), or due to medications or treatments (example: therapies used to treat cancer and rheumatological diseases). However, even if you do not have one on these, it can still  happen. If you have any of these conditions, or take one of these drugs, make sure to notify your treating physician. Nerve Damage: This is more common when the treatment is an invasive one, but it can also happen with the use of medications, such as those used in the treatment of cancer. The damage can occur to small secondary nerves, or to large primary ones, such as those in the spinal cord and brain. This damage may be temporary or permanent and it may lead to impairments that can range from temporary numbness to permanent paralysis and/or brain death. Allergic Reactions: Any time a substance or material comes in contact with our body, there is the possibility of an allergic reaction. These can range from a mild skin rash (contact dermatitis) to a severe systemic reaction (anaphylactic reaction), which can result in death. Death: In general, any medical intervention can result in death, most of the time due to an unforeseen complication. ______________________________________________________________________      ______________________________________________________________________    Steroid injections  Common steroids for injections Triamcinolone : Used by many sports medicine physicians for large joint and bursal injections, often combined with a local anesthetic like lidocaine . A study focusing on coccydynia (tailbone pain) found triamcinolone  was more effective than betamethasone, suggesting it may also be preferable for other localized inflammation conditions. Methylprednisolone: A common alternative to triamcinolone  that is also a strong anti-inflammatory. It is available in different formulations, with the acetate suspension being the long-acting option for intra-articular injections. Dexamethasone : This is a non-particulate steroid, meaning it has a lower risk of tissue damage compared to particulate steroids like triamcinolone  and methylprednisolone. While less common for this specific  use, it is an option for targeted injections.   Considerations for physicians Particulate vs. non-particulate steroids: Triamcinolone  and methylprednisolone are particulate, meaning they can clump together. Dexamethasone  is non-particulate. Particulate steroids are often preferred for their longer-lasting effects but carry a theoretical higher risk for certain injections (though this is less of a concern in the costochondral joints). Combined injectate: Corticosteroids are typically mixed with a local anesthetic like lidocaine  to provide both immediate pain relief (from the anesthetic) and longer-term inflammation reduction (from the steroid). Imaging guidance: To ensure accurate placement of the needle and medication, physicians may use ultrasound or fluoroscopic guidance for the injection, especially in complex or refractory cases.   Patient guidance Before undergoing a steroid injection, discuss the options with your physician. They will determine the best steroid, dosage, and procedure for your specific case based on factors like: Severity of your condition History of response to other treatments Your overall health status Experience and preference of the physician  Last  Updated: 07/07/2024 ______________________________________________________________________     "

## 2025-01-07 ENCOUNTER — Ambulatory Visit: Admitting: Pain Medicine
